# Patient Record
Sex: Male | Born: 1970 | Race: Black or African American | Hispanic: No | Marital: Married | Smoking: Never smoker
Health system: Southern US, Community
[De-identification: ages and names within clinical notes are randomized; demographics above are authoritative.]

## PROBLEM LIST (undated history)

## (undated) ENCOUNTER — Emergency Department (HOSPITAL_COMMUNITY): Admission: EM | Discharge status: Absent without leave | Payer: No Typology Code available for payment source

## (undated) DIAGNOSIS — M2550 Pain in unspecified joint: Secondary | ICD-10-CM

## (undated) DIAGNOSIS — M069 Rheumatoid arthritis, unspecified: Secondary | ICD-10-CM

## (undated) HISTORY — DX: Pain in unspecified joint: M25.50

## (undated) HISTORY — PX: VASECTOMY: SHX75

---

## 2014-09-22 ENCOUNTER — Emergency Department (HOSPITAL_COMMUNITY)
Admission: EM | Admit: 2014-09-22 | Discharge: 2014-09-22 | Disposition: A | Discharge status: Home / Self Care | Payer: Medicaid Other | Source: Home / Self Care | Attending: Emergency Medicine | Admitting: Emergency Medicine

## 2014-09-22 ENCOUNTER — Encounter (HOSPITAL_COMMUNITY): Payer: Self-pay | Admitting: Emergency Medicine

## 2014-09-22 DIAGNOSIS — T148 Other injury of unspecified body region: Secondary | ICD-10-CM

## 2014-09-22 DIAGNOSIS — M62830 Muscle spasm of back: Secondary | ICD-10-CM

## 2014-09-22 DIAGNOSIS — T148XXA Other injury of unspecified body region, initial encounter: Secondary | ICD-10-CM

## 2014-09-22 MED ORDER — CYCLOBENZAPRINE HCL 5 MG PO TABS: 5.0000 mg | ORAL_TABLET | Freq: Three times a day (TID) | ORAL | Status: DC | PRN

## 2014-09-22 MED ORDER — MELOXICAM 7.5 MG PO TABS: 7.5000 mg | ORAL_TABLET | Freq: Every day | ORAL | Status: DC

## 2014-09-22 NOTE — ED Provider Notes (Signed)
CSN: [id number]     Arrival date & time 09/22/14  1615 History   First MD Initiated Contact with Patient 09/22/14 1744     Chief Complaint  Patient presents with  . Back Pain   HPI  44 yo healthy male.   Left sided mid back pain started this am after reaching to pick up child. Has had tenderness in that area previously. No trauma. Denies radiation. Constant since onset. Rated 6/10. Denies sob, fevers, chills, hematuria.   History reviewed. No pertinent past medical history. History reviewed. No pertinent past surgical history. History reviewed. No pertinent family history. History  Substance Use Topics  . Smoking status: Never Smoker   . Smokeless tobacco: Not on file  . Alcohol Use: Yes     Comment: occasional social    Review of Systems See HPI. Allergies  Review of patient's allergies indicates no known allergies.  Home Medications   Prior to Admission medications   Medication Sig Start Date End Date Taking? Authorizing Provider  cyclobenzaprine (FLEXERIL) 5 MG tablet Take 1 tablet (5 mg total) by mouth 3 (three) times daily as needed for muscle spasms. 09/22/14   Raelyn Ensign, PA  meloxicam (MOBIC) 7.5 MG tablet Take 1 tablet (7.5 mg total) by mouth daily. 09/22/14   Kailand Seda, PA   BP 126/76 mmHg  Pulse 50  Temp(Src) 98.3 F (36.8 C) (Oral)  Resp 14  SpO2 100% Physical Exam  Constitutional: He is oriented to person, place, and time. He appears well-developed and well-nourished.  Non-toxic appearance. He does not have a sickly appearance. He does not appear ill. No distress.  BP 126/76 mmHg  Pulse 50  Temp(Src) 98.3 F (36.8 C) (Oral)  Resp 14  SpO2 100%   Musculoskeletal:       Cervical back: Normal.       Thoracic back: He exhibits tenderness and spasm. He exhibits no bony tenderness.       Lumbar back: Normal.       Back:  TTP thoracic left paraspinal. Spasm palpated in area. No overlying skin changes.   Neurological: He is alert and oriented to  person, place, and time.   ED Course  Procedures (including critical care time) Labs Review Labs Reviewed - No data to display  Imaging Review No results found.   MDM   1. Muscle strain   2. Muscle spasm of back    Likely strain with mild spasm. Aggravated previously irritated area this morning. Meloxicam qd. Flexeril q8 prn. Heat, light massage. Light rom as feeling better. RTC 10 days if no relief.     Raelyn Ensign, [place] 09/22/14 1843

## 2014-09-22 NOTE — Discharge Instructions (Signed)
Please take the flexeril every 8 hours to help with the spasm. This may make you drowsy so use caution while working and driving if you take this during the day. Please take the mobic once daily for 2 weeks. Applying heat to the area daily with light massage will help. If you're not improved in 2 weeks please come back to see [place].   Muscle Cramps and Spasms Muscle cramps and spasms occur when a muscle or muscles tighten and you have no control over this tightening (involuntary muscle contraction). They are a common problem and can develop in any muscle. The most common place is in the calf muscles of the leg. Both muscle cramps and muscle spasms are involuntary muscle contractions, but they also have differences:   Muscle cramps are sporadic and painful. They may last a few seconds to a quarter of an hour. Muscle cramps are often more forceful and last longer than muscle spasms.  Muscle spasms may or may not be painful. They may also last just a few seconds or much longer. CAUSES  It is uncommon for cramps or spasms to be due to a serious underlying problem. In many cases, the cause of cramps or spasms is unknown. Some common causes are:   Overexertion.   Overuse from repetitive motions (doing the same thing over and over).   Remaining in a certain position for a long period of time.   Improper preparation, form, or technique while performing a sport or activity.   Dehydration.   Injury.   Side effects of some medicines.   Abnormally low levels of the salts and ions in your blood (electrolytes), especially potassium and calcium. This could happen if you are taking water pills (diuretics) or you are pregnant.  Some underlying medical problems can make it more likely to develop cramps or spasms. These include, but are not limited to:   Diabetes.   Parkinson disease.   Hormone disorders, such as thyroid problems.   Alcohol abuse.   Diseases specific to muscles, joints,  and bones.   Blood vessel disease where not enough blood is getting to the muscles.  HOME CARE INSTRUCTIONS   Stay well hydrated. Drink enough water and fluids to keep your urine clear or pale yellow.  It may be helpful to massage, stretch, and relax the affected muscle.  For tight or tense muscles, use a warm towel, heating pad, or hot shower water directed to the affected area.  If you are sore or have pain after a cramp or spasm, applying ice to the affected area may relieve discomfort.  Put ice in a plastic bag.  Place a towel between your skin and the bag.  Leave the ice on for 15-20 minutes, 03-04 times a day.  Medicines used to treat a known cause of cramps or spasms may help reduce their frequency or severity. Only take over-the-counter or prescription medicines as directed by your caregiver. SEEK MEDICAL CARE IF:  Your cramps or spasms get more severe, more frequent, or do not improve over time.  MAKE SURE YOU:   Understand these instructions.  Will watch your condition.  Will get help right away if you are not doing well or get worse. Document Released: 08/05/2001 Document Revised: 06/10/2012 Document Reviewed: 01/31/2012 [facility] Patient Information 2015 [place], [place]. This information is not intended to replace advice given to you by your health care provider. Make sure you discuss any questions you have with your health care provider.

## 2014-09-22 NOTE — ED Notes (Signed)
Pt states that he has back pain from what he believes is that he pulled something in his back as he was lifting a heavy object

## 2014-11-05 ENCOUNTER — Emergency Department (HOSPITAL_COMMUNITY)
Admission: EM | Admit: 2014-11-05 | Discharge: 2014-11-05 | Disposition: A | Discharge status: Home / Self Care | Payer: Medicaid Other | Attending: Emergency Medicine | Admitting: Emergency Medicine

## 2014-11-05 ENCOUNTER — Emergency Department (HOSPITAL_COMMUNITY)
Admission: EM | Admit: 2014-11-05 | Discharge: 2014-11-05 | Discharge status: Absent without leave | Payer: Medicaid Other | Source: Home / Self Care

## 2014-11-05 ENCOUNTER — Encounter (HOSPITAL_COMMUNITY): Payer: Self-pay | Admitting: Emergency Medicine

## 2014-11-05 DIAGNOSIS — Y280XXA Contact with sharp glass, undetermined intent, initial encounter: Secondary | ICD-10-CM | POA: Insufficient documentation

## 2014-11-05 DIAGNOSIS — S60312A Abrasion of left thumb, initial encounter: Secondary | ICD-10-CM | POA: Insufficient documentation

## 2014-11-05 DIAGNOSIS — S60512A Abrasion of left hand, initial encounter: Secondary | ICD-10-CM | POA: Diagnosis not present

## 2014-11-05 DIAGNOSIS — Y9389 Activity, other specified: Secondary | ICD-10-CM | POA: Insufficient documentation

## 2014-11-05 DIAGNOSIS — Y929 Unspecified place or not applicable: Secondary | ICD-10-CM | POA: Insufficient documentation

## 2014-11-05 DIAGNOSIS — Y999 Unspecified external cause status: Secondary | ICD-10-CM | POA: Diagnosis not present

## 2014-11-05 DIAGNOSIS — S61412A Laceration without foreign body of left hand, initial encounter: Secondary | ICD-10-CM | POA: Insufficient documentation

## 2014-11-05 DIAGNOSIS — Z791 Long term (current) use of non-steroidal anti-inflammatories (NSAID): Secondary | ICD-10-CM | POA: Insufficient documentation

## 2014-11-05 DIAGNOSIS — S6992XA Unspecified injury of left wrist, hand and finger(s), initial encounter: Secondary | ICD-10-CM | POA: Diagnosis present

## 2014-11-05 MED ORDER — BACITRACIN ZINC 500 UNIT/GM EX OINT
TOPICAL_OINTMENT | Freq: Once | CUTANEOUS | Status: AC
  Administered 2014-11-05: 1 via TOPICAL

## 2014-11-05 MED ORDER — TETANUS-DIPHTH-ACELL PERTUSSIS 5-2.5-18.5 LF-MCG/0.5 IM SUSP
0.5000 mL | Freq: Once | INTRAMUSCULAR | Status: AC
  Administered 2014-11-05: 0.5 mL via INTRAMUSCULAR
  Filled 2014-11-05: qty 0.5

## 2014-11-05 MED ORDER — LIDOCAINE HCL (PF) 1 % IJ SOLN
5.0000 mL | Freq: Once | INTRAMUSCULAR | Status: AC
  Administered 2014-11-05: 5 mL
  Filled 2014-11-05: qty 5

## 2014-11-05 NOTE — ED Notes (Signed)
Patient states was moving a sofa and hit his hand on metal trash bin.   Laceration to top of L hand.   Bleeding controlled.

## 2014-11-05 NOTE — ED Provider Notes (Signed)
CSN: [id number]     Arrival date & time 11/05/14  1427 History  This chart was scribed for non-physician practitioner, Kerrie Buffalo, NP working with Arby Barrette, MD, by Jarvis Morgan, ED Scribe. This patient was seen in room TR08C/TR08C and the patient's care was started at 3:01 PM.     Chief Complaint  Patient presents with  . Hand Injury   Patient is a 44 y.o. male presenting with skin laceration. The history is provided by the patient. No language interpreter was used.  Laceration Location:  Hand Hand laceration location:  Dorsum of L hand Length (cm):  1.5 Depth:  Through dermis Bleeding: controlled   Time since incident:  1 hour Laceration mechanism:  Metal edge Pain details:    Severity:  Mild   Timing:  Constant   Progression:  Unchanged Relieved by:  Pressure Tetanus status:  Unknown   HPI Comments: Darryl Reid is a 44 y.o. male who presents to the Emergency Department complaining of a laceration to the dorsum of left hand onset 1 hour ago. Pt states he was moving a sofa and hit his hand on a metal trash can. There is no active bleeding at this time. Pt has a gauze bandage in place over the wound. He is unsure of his tetanus vaccination status. Pt denies any swelling, numbness or weakness.   History reviewed. No pertinent past medical history. History reviewed. No pertinent past surgical history. No family history on file. Social History  Substance Use Topics  . Smoking status: Never Smoker   . Smokeless tobacco: None  . Alcohol Use: Yes     Comment: occasional social    Review of Systems  Musculoskeletal: Negative for joint swelling.  Skin: Positive for wound (dorsum of L hand).  Neurological: Negative for weakness and numbness.  All other systems reviewed and are negative.     Allergies  Review of patient's allergies indicates no known allergies.  Home Medications   Prior to Admission medications   Medication Sig Start Date End Date Taking?  Authorizing Provider  cyclobenzaprine (FLEXERIL) 5 MG tablet Take 1 tablet (5 mg total) by mouth 3 (three) times daily as needed for muscle spasms. 09/22/14   Raelyn Ensign, PA  meloxicam (MOBIC) 7.5 MG tablet Take 1 tablet (7.5 mg total) by mouth daily. 09/22/14   Raelyn Ensign, PA   Triage Vitals: BP 111/89 mmHg  Pulse 70  Temp(Src) 98.6 F (37 C)  Resp 20  SpO2 100%  Physical Exam  Constitutional: He is oriented to person, place, and time. He appears well-developed and well-nourished.  HENT:  Head: Normocephalic and atraumatic.  Eyes: EOM are normal. Pupils are equal, round, and reactive to light.  Neck: Neck supple.  Cardiovascular: Normal rate and regular rhythm.   Pulses:      Radial pulses are 2+ on the right side, and 2+ on the left side.  Pulmonary/Chest: No respiratory distress. He has no wheezes.  Abdominal: Soft. There is no tenderness.  Musculoskeletal: Normal range of motion. He exhibits no edema.       Right hand: He exhibits laceration. He exhibits normal range of motion.  Neurological: He is alert and oriented to person, place, and time. He has normal strength. No cranial nerve deficit.  Skin: Skin is warm and dry. Abrasion and laceration noted.  1.5 cm laceration to the dorsum of left hand below base of index finger 1 cm abrasion at base of thumb  Psychiatric: He has a normal  mood and affect.  Nursing note and vitals reviewed.   ED Course  Procedures (including critical care time)  DIAGNOSTIC STUDIES: Oxygen Saturation is 100% on RA, normal by my interpretation.    COORDINATION OF CARE: 3:12 PM- Pt advised of plan for treatment and pt agrees. Will order t-dap booster injection.  3:19 PM  LACERATION REPAIR Performed by: Kerrie Buffalo, NP Consent: Verbal consent obtained. Risks and benefits: risks, benefits and alternatives were discussed Patient identity confirmed: provided demographic data Prepped and Draped in normal sterile fashion Wound  explored Laceration Location: dorsum of left hand below base of thumb Laceration Length: 1.5cm No Foreign Bodies seen or palpated Cleaned with betadine and irrigated with NSS Anesthesia: local infiltration Local anesthetic: lidocaine 1% w/o epinephrine Anesthetic total: 1 ml Irrigation method: syringe Amount of cleaning: standard Skin closure: 5-0 prolene Number of sutures: 2 Technique: simple interrupted Tetanus ordered Patient tolerance: Patient tolerated the procedure well with no immediate complications.    MDM  44 y.o. male with laceration to the dorsum of the left hand and abrasion to the hand. Stable for d/c without focal neuro deficits. Discussed with the patient plan of care and all questioned fully answered. He will follow up in 7 days for suture removal or sooner if any problems arise.   Final diagnoses:  Laceration of hand, left, initial encounter   I personally performed the services described in this documentation, which was scribed in my presence. The recorded information has been reviewed and is accurate.     [place], [place] 11/05/14 2144  Arby Barrette, MD 11/11/14 [phone]

## 2014-11-05 NOTE — Discharge Instructions (Signed)
Take tylenol or Advil as needed for pain. Follow up in 7 days for suture removal.

## 2014-11-13 ENCOUNTER — Encounter (HOSPITAL_COMMUNITY): Payer: Self-pay | Admitting: Emergency Medicine

## 2014-11-13 ENCOUNTER — Emergency Department (HOSPITAL_COMMUNITY)
Admission: EM | Admit: 2014-11-13 | Discharge: 2014-11-13 | Disposition: A | Discharge status: Home / Self Care | Payer: Medicaid Other | Attending: Emergency Medicine | Admitting: Emergency Medicine

## 2014-11-13 DIAGNOSIS — Z4802 Encounter for removal of sutures: Secondary | ICD-10-CM | POA: Diagnosis present

## 2014-11-13 DIAGNOSIS — Z791 Long term (current) use of non-steroidal anti-inflammatories (NSAID): Secondary | ICD-10-CM | POA: Diagnosis not present

## 2014-11-13 NOTE — Discharge Instructions (Signed)
Read the information below.  You may return to the Emergency Department at any time for worsening condition or any new symptoms that concern you.  Use bacitracin or neosporin and cover the cut with a bandage.  If you develop redness, swelling, pus draining from the wound, or fevers greater than 100.4, return to the ER immediately for a recheck.     Suture Removal, Care After Refer to this sheet in the next few weeks. These instructions provide you with information on caring for yourself after your procedure. Your health care provider may also give you more specific instructions. Your treatment has been planned according to current medical practices, but problems sometimes occur. Call your health care provider if you have any problems or questions after your procedure. WHAT TO EXPECT AFTER THE PROCEDURE After your stitches (sutures) are removed, it is typical to have the following:  Some discomfort and swelling in the wound area.  Slight redness in the area. HOME CARE INSTRUCTIONS   If you have skin adhesive strips over the wound area, do not take the strips off. They will fall off on their own in a few days. If the strips remain in place after 14 days, you may remove them.  Change any bandages (dressings) at least once a day or as directed by your health care provider. If the bandage sticks, soak it off with warm, soapy water.  Apply cream or ointment only as directed by your health care provider. If using cream or ointment, wash the area with soap and water 2 times a day to remove all the cream or ointment. Rinse off the soap and pat the area dry with a clean towel.  Keep the wound area dry and clean. If the bandage becomes wet or dirty, or if it develops a bad smell, change it as soon as possible.  Continue to protect the wound from injury.  Use sunscreen when out in the sun. New scars become sunburned easily. SEEK MEDICAL CARE IF:  You have increasing redness, swelling, or pain in the  wound.  You see pus coming from the wound.  You have a fever.  You notice a bad smell coming from the wound or dressing.  Your wound breaks open (edges not staying together). Document Released: 11/08/2000 Document Revised: 12/04/2012 Document Reviewed: 09/25/2012 [facility] Patient Information 2015 [place], [place]. This information is not intended to replace advice given to you by your health care provider. Make sure you discuss any questions you have with your health care provider.

## 2014-11-13 NOTE — ED Notes (Addendum)
Pt here for suture removal from left hand. No drainage or redness noted.

## 2014-11-13 NOTE — ED Provider Notes (Signed)
CSN: [id number]     Arrival date & time 11/13/14  5188 History   First MD Initiated Contact with Patient 11/13/14 [phone]     Chief Complaint  Patient presents with  . Suture / Staple Removal     (Consider location/radiation/quality/duration/timing/severity/associated sxs/prior Treatment) HPI   Patient presents for suture removal from dorsal left hand.  He had the sutures placed 11/05/14 after accidentally cutting his hand on a trash can.  The wound has been healing well without complications.  Denies fevers, redness, swelling, drainage or bleeding.  Denies weakness or numbness.    History reviewed. No pertinent past medical history. History reviewed. No pertinent past surgical history. No family history on file. Social History  Substance Use Topics  . Smoking status: Never Smoker   . Smokeless tobacco: None  . Alcohol Use: Yes     Comment: occasional social    Review of Systems  Constitutional: Negative for fever and chills.  Musculoskeletal: Negative for myalgias.  Skin: Positive for wound. Negative for color change.  Allergic/Immunologic: Negative for immunocompromised state.  Neurological: Negative for weakness and numbness.  Hematological: Does not bruise/bleed easily.      Allergies  Review of patient's allergies indicates no known allergies.  Home Medications   Prior to Admission medications   Medication Sig Start Date End Date Taking? Authorizing Provider  cyclobenzaprine (FLEXERIL) 5 MG tablet Take 1 tablet (5 mg total) by mouth 3 (three) times daily as needed for muscle spasms. 09/22/14   Raelyn Ensign, PA  meloxicam (MOBIC) 7.5 MG tablet Take 1 tablet (7.5 mg total) by mouth daily. 09/22/14   Todd McVeigh, PA   BP 112/69 mmHg  Pulse 70  Temp(Src) 98.2 F (36.8 C) (Oral)  Resp 18  SpO2 100% Physical Exam  Constitutional: He appears well-developed and well-nourished. No distress.  HENT:  Head: Normocephalic and atraumatic.  Neck: Neck supple.  Cardiovascular:  Normal rate.   Pulmonary/Chest: Effort normal.  Musculoskeletal:  Left dorsal hand with healing laceration.  No erythema, edema, warmth, discharge, or tenderness. No induration or fluctuance. Full active range of motion of all digits, strength 5/5, sensation intact, capillary refill < 2 seconds.    Neurological: He is alert.  Skin: He is not diaphoretic.  Nursing note and vitals reviewed.   ED Course  Procedures (including critical care time) Labs Review Labs Reviewed - No data to display  Imaging Review No results found. I have personally reviewed and evaluated these images and lab results as part of my medical decision-making.   EKG Interpretation None       SUTURE REMOVAL Performed by: Trixie Dredge  Consent: Verbal consent obtained. Patient identity confirmed: provided demographic data Time out: Immediately prior to procedure a "time out" was called to verify the correct patient, procedure, equipment, support staff and site/side marked as required.  Location details: dorsal hand  Wound Appearance: clean  Sutures/Staples Removed: 2  Facility: sutures placed in this facility Patient tolerance: Patient tolerated the procedure well with no immediate complications.     MDM   Final diagnoses:  Visit for suture removal    Afebrile, nontoxic patient with left dorsal hand with 2 sutures in place without infection, healing well.  Sutures removed, wound dressed.   D/C home with wound care instructions.  Discussed result, findings, treatment, and follow up  with patient.  Pt given return precautions.  Pt verbalizes understanding and agrees with plan.         [place], PA-C 11/13/14 [phone]  Margarita Grizzle, MD 11/13/14 [phone]

## 2014-12-08 ENCOUNTER — Emergency Department (HOSPITAL_COMMUNITY): Payer: No Typology Code available for payment source

## 2014-12-08 ENCOUNTER — Encounter (HOSPITAL_COMMUNITY): Payer: Self-pay | Admitting: Emergency Medicine

## 2014-12-08 ENCOUNTER — Emergency Department (HOSPITAL_COMMUNITY)
Admission: EM | Admit: 2014-12-08 | Discharge: 2014-12-08 | Disposition: A | Discharge status: Home / Self Care | Payer: No Typology Code available for payment source | Attending: Emergency Medicine | Admitting: Emergency Medicine

## 2014-12-08 DIAGNOSIS — S4992XA Unspecified injury of left shoulder and upper arm, initial encounter: Secondary | ICD-10-CM | POA: Diagnosis present

## 2014-12-08 DIAGNOSIS — Y92410 Unspecified street and highway as the place of occurrence of the external cause: Secondary | ICD-10-CM | POA: Insufficient documentation

## 2014-12-08 DIAGNOSIS — Y998 Other external cause status: Secondary | ICD-10-CM | POA: Diagnosis not present

## 2014-12-08 DIAGNOSIS — R911 Solitary pulmonary nodule: Secondary | ICD-10-CM | POA: Diagnosis not present

## 2014-12-08 DIAGNOSIS — S0990XA Unspecified injury of head, initial encounter: Secondary | ICD-10-CM | POA: Insufficient documentation

## 2014-12-08 DIAGNOSIS — Y9389 Activity, other specified: Secondary | ICD-10-CM | POA: Insufficient documentation

## 2014-12-08 MED ORDER — METHOCARBAMOL 500 MG PO TABS: 500.0000 mg | ORAL_TABLET | Freq: Two times a day (BID) | ORAL | Status: DC

## 2014-12-08 MED ORDER — MELOXICAM 7.5 MG PO TABS: 7.5000 mg | ORAL_TABLET | Freq: Every day | ORAL | Status: DC

## 2014-12-08 MED ORDER — KETOROLAC TROMETHAMINE 60 MG/2ML IM SOLN
60.0000 mg | Freq: Once | INTRAMUSCULAR | Status: AC
  Administered 2014-12-08: 60 mg via INTRAMUSCULAR
  Filled 2014-12-08: qty 2
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
## 2014-12-08 NOTE — Discharge Instructions (Signed)
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
## 2015-01-06 ENCOUNTER — Ambulatory Visit (INDEPENDENT_AMBULATORY_CARE_PROVIDER_SITE_OTHER): Payer: Medicaid Other | Admitting: Family Medicine

## 2015-01-06 VITALS — BP 123/78 | HR 86 | Temp 98.2°F | Ht 65.5 in | Wt 133.6 lb

## 2015-01-06 DIAGNOSIS — Z008 Encounter for other general examination: Secondary | ICD-10-CM

## 2015-01-06 DIAGNOSIS — Z0289 Encounter for other administrative examinations: Secondary | ICD-10-CM

## 2015-01-06 DIAGNOSIS — M545 Low back pain, unspecified: Secondary | ICD-10-CM | POA: Insufficient documentation

## 2015-01-06 DIAGNOSIS — G8929 Other chronic pain: Secondary | ICD-10-CM | POA: Diagnosis not present

## 2015-01-06 NOTE — Patient Instructions (Addendum)
Please follow up on 12/8 If you have questions or concerns, call the office at [phone]  Back Pain, Adult Back pain is very common. The pain often gets better over time. The cause of back pain is usually not dangerous. Most people can learn to manage their back pain on their own.  HOME CARE  Watch your back pain for any changes. The following actions may help to lessen any pain you are feeling:  Stay active. Start with short walks on flat ground if you can. Try to walk farther each day.  Exercise regularly as told by your doctor. Exercise helps your back heal faster. It also helps avoid future injury by keeping your muscles strong and flexible.  Do not sit, drive, or stand in one place for more than 30 minutes.  Do not stay in bed. Resting more than 1-2 days can slow down your recovery.  Be careful when you bend or lift an object. Use good form when lifting:  Bend at your knees.  Keep the object close to your body.  Do not twist.  Sleep on a firm mattress. Lie on your side, and bend your knees. If you lie on your back, put a pillow under your knees.  Take medicines only as told by your doctor.  Put ice on the injured area.  Put ice in a plastic bag.  Place a towel between your skin and the bag.  Leave the ice on for 20 minutes, 2-3 times a day for the first 2-3 days. After that, you can switch between ice and heat packs.  Avoid feeling anxious or stressed. Find good ways to deal with stress, such as exercise.  Maintain a healthy weight. Extra weight puts stress on your back. GET HELP IF:   You have pain that does not go away with rest or medicine.  You have worsening pain that goes down into your legs or buttocks.  You have pain that does not get better in one week.  You have pain at night.  You lose weight.  You have a fever or chills. GET HELP RIGHT AWAY IF:   You cannot control when you poop (bowel movement) or pee (urinate).  Your arms or legs feel  weak.  Your arms or legs lose feeling (numbness).  You feel sick to your stomach (nauseous) or throw up (vomit).  You have belly (abdominal) pain.  You feel like you may pass out (faint).   This information is not intended to replace advice given to you by your health care provider. Make sure you discuss any questions you have with your health care provider.   Document Released: 08/02/2007 Document Revised: 03/06/2014 Document Reviewed: 06/17/2013 Elsevier Interactive Patient Education [employer]

## 2015-01-06 NOTE — Assessment & Plan Note (Signed)
Likely chronic musculoskeletal pain contributed to by injury histroy -Pt to continue with NSAIDs, topical pain relief. Exercises also discussed and print outs given

## 2015-01-06 NOTE — Assessment & Plan Note (Signed)
Follow up labs

## 2015-01-06 NOTE — Progress Notes (Signed)
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 It " just happened" and was not associated with any trauma, he had no radiation at that time, numbness, tingling, loss or bowel or bladder function The pain worsens with activity or prolonged sitting, standing makes it better,   He also reports fainted on 10/11 after working all night. He suddenly felt hot and dizzy then fell to the floor and lost consciousness. He was in the shower initially but when he felt dizzy he left the shower. His wife found him on the floor. He thinks he lost consciousness for 2-3 minutes. He denies shaking or jerking movements and was normal after woke up  Denies recent illness, fever, cough, N/VD  ROS:  See HPI  Past Medical Hx:  - denies  Past Surgical Hx:  -denies  Family Hx: updated in Epic - Number of family members:  4 - Number of family members in [place]:  4  Immigrant Social History: - Name spelling correct?: Darryl Reid - Date arrived in [place]: 09/08/2014 - Country of origin: [place] - traveled to [place] in 2012 after leaving the [place] due to the war - Primary language: Swahili  -Requires intepreter (essentially speaks no [place]) - Education: Highest level of education: 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  No   Preventative Care Histor -Seen at health department?: Yes  PHYSICAL EXAM: BP 123/78 mmHg  Pulse 86  Temp(Src) 98.2 F (36.8 C) (Oral)  Ht 5' 5.5" (1.664 m)  Wt 133 lb 9.6 oz (60.601 kg)  BMI 21.89 kg/m2 Gen: NAD HEENT: PERRL, no conjunctival pallor, NCAT Neck:  No  lymphadenopathy, no masses palpated Heart: RRR Lungs: CTAB, normal WOB Abdomen: soft, non tender, no organomegally Skin:  No rashes or lesions palpated MSK: Normal leg raise, no tack tenderness, some concern for back muscle spasm Neuro: no focal deficits Psych: normal mood and affect  Examined and interviewed with Dr. Gwendolyn Grant  Refugee health examination Follow up labs  Chronic low back pain Likely chronic musculoskeletal pain contributed to by injury histroy -Pt to continue with NSAIDs, topical pain relief. Exercises also discussed and print outs given

## 2015-01-27 ENCOUNTER — Ambulatory Visit: Payer: Medicaid Other | Admitting: Student

## 2015-02-04 ENCOUNTER — Ambulatory Visit (INDEPENDENT_AMBULATORY_CARE_PROVIDER_SITE_OTHER): Payer: Medicaid Other | Admitting: Student

## 2015-02-04 ENCOUNTER — Encounter: Payer: Self-pay | Admitting: Student

## 2015-02-04 VITALS — BP 109/74 | HR 57 | Temp 98.4°F | Wt 131.7 lb

## 2015-02-04 DIAGNOSIS — Z008 Encounter for other general examination: Secondary | ICD-10-CM | POA: Diagnosis not present

## 2015-02-04 DIAGNOSIS — Z0289 Encounter for other administrative examinations: Secondary | ICD-10-CM

## 2015-02-04 DIAGNOSIS — R55 Syncope and collapse: Secondary | ICD-10-CM

## 2015-02-04 NOTE — Patient Instructions (Signed)
Follow up in 3 months Please bring your family's health department information If you have questions or concerns, please call the office at [phone]

## 2015-02-05 NOTE — Progress Notes (Signed)
   Subjective:    Patient ID: Roney Jaffe, male    DOB: 07/28/1970, 44 y.o.   MRN: [id number]  No interpreter needed as he speaks fluent english  CC: follow up  HPI 44 y/o refugee presents for follow up. He has no complaints today  Refugee status - He states he has been to the health department and has had his vaccines as well as labs drawn.  - He has this documentation at home  Otherwise, denies recent fevers/chills, illnesses, chest pain, SOB  Review of Systems   See HPI for ROS.     Objective:  BP 109/74 mmHg  Pulse 57  Temp(Src) 98.4 F (36.9 C) (Oral)  Wt 131 lb 11.2 oz (59.739 kg) Vitals and nursing note reviewed  General: NAD Cardiac: RRR, Respiratory: CTAB, normal effort Abdomen: soft, nontender,  Skin: warm and dry, no rashes noted Neuro: alert and oriented, no focal deficits   Assessment & Plan:    Refugee health examination Has had vaccines as well as refugee labs drawn at the health department - He will bring this documentation to the clinic and we will supplement testing as needed - Will not draw all labs now to reduce unnecessary bills     Agnieszka Newhouse A. Kennon Rounds MD, MS Family Medicine Resident PGY-2 Pager [phone]

## 2015-02-05 NOTE — Assessment & Plan Note (Signed)
Has had vaccines as well as refugee labs drawn at the health department - He will bring this documentation to the clinic and we will supplement testing as needed - Will not draw all labs now to reduce unnecessary bills

## 2015-05-04 ENCOUNTER — Ambulatory Visit: Payer: Medicaid Other | Admitting: Student
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
## 2018-04-29 ENCOUNTER — Ambulatory Visit (HOSPITAL_COMMUNITY)
Admission: EM | Admit: 2018-04-29 | Discharge: 2018-04-29 | Disposition: A | Discharge status: Home / Self Care | Payer: Self-pay | Attending: Internal Medicine | Admitting: Internal Medicine

## 2018-04-29 ENCOUNTER — Other Ambulatory Visit: Payer: Self-pay

## 2018-04-29 ENCOUNTER — Encounter (HOSPITAL_COMMUNITY): Payer: Self-pay | Admitting: Emergency Medicine

## 2018-04-29 DIAGNOSIS — R69 Illness, unspecified: Secondary | ICD-10-CM

## 2018-04-29 DIAGNOSIS — J111 Influenza due to unidentified influenza virus with other respiratory manifestations: Secondary | ICD-10-CM

## 2018-04-29 MED ORDER — GUAIFENESIN ER 600 MG PO TB12: 1200.0000 mg | ORAL_TABLET | Freq: Two times a day (BID) | ORAL | 0 refills | Status: AC | PRN

## 2018-04-29 MED ORDER — IBUPROFEN 800 MG PO TABS: 800.0000 mg | ORAL_TABLET | Freq: Three times a day (TID) | ORAL | 0 refills | Status: DC

## 2018-04-29 MED ORDER — KETOROLAC TROMETHAMINE 60 MG/2ML IM SOLN
60.0000 mg | Freq: Once | INTRAMUSCULAR | Status: AC
  Administered 2018-04-29: 60 mg via INTRAMUSCULAR

## 2018-04-29 MED ORDER — KETOROLAC TROMETHAMINE 60 MG/2ML IM SOLN
INTRAMUSCULAR | Status: AC
  Filled 2018-04-29: qty 2

## 2018-04-29 NOTE — ED Triage Notes (Signed)
Coughing started on Thursday ( 2/27 ).  Generalized weakness, cold chills, and headaches

## 2018-04-29 NOTE — Discharge Instructions (Signed)
Push fluids to ensure adequate hydration and keep secretions thin.  Tylenol and/or ibuprofen as needed for pain or fevers.  Rest.  Mucinex to loosen secretion.  If symptoms worsen or do not improve in the next week to return to be seen or to follow up with your PCP.

## 2018-04-30 NOTE — ED Provider Notes (Signed)
[facility]    CSN: [id number] Arrival date & time: 04/29/18  1546     History   Chief Complaint Chief Complaint  Patient presents with  . Cough    HPI Darryl Reid is a 48 y.o. male.   Darryl Reid presents with complaints of weakness, productive cough, headache, body aches which started three days ago. Unknown if fevers. States he has a "bad taste" in his mouth but no other gi/gu complaints. No ear pain. No rash. Sometimes feels shortness of breath but not consistently. No known ill contacts. No rash. Hasn't worsened. Hasn't taken any medications today for symptoms, took ibuprofen and nyquil yesterday. No chest pain . No dizziness. Generalized pain 4/10. Without contributing medical history.      ROS per HPI.      History reviewed. No pertinent past medical history.  Patient Active Problem List   Diagnosis Date Noted  . Vasovagal syncope 02/04/2015  . Refugee health examination 01/06/2015  . Chronic low back pain 01/06/2015    History reviewed. No pertinent surgical history.     Home Medications    Prior to Admission medications   Medication Sig Start Date End Date Taking? Authorizing Provider  acetaminophen (TYLENOL) 325 MG tablet Take 650 mg by mouth every 6 (six) hours as needed.   Yes [provider]  guaiFENesin (MUCINEX) 600 MG 12 hr tablet Take 2 tablets (1,200 mg total) by mouth 2 (two) times daily as needed for up to 5 days. 04/29/18 05/04/18  Georgetta Haber, NP  ibuprofen (ADVIL,MOTRIN) 800 MG tablet Take 1 tablet (800 mg total) by mouth 3 (three) times daily. 04/29/18   Georgetta Haber, NP    Family History No family history on file.  Social History Social History   Tobacco Use  . Smoking status: Never Smoker  Substance Use Topics  . Alcohol use: Yes    Comment: occasional social  . Drug use: No     Allergies   Patient has no known allergies.   Review of Systems Review of Systems   Physical Exam Triage  Vital Signs ED Triage Vitals  Enc Vitals Group     BP 04/29/18 1711 109/63     Pulse Rate 04/29/18 1711 70     Resp 04/29/18 1711 16     Temp 04/29/18 1711 98.9 F (37.2 C)     Temp Source 04/29/18 1711 Temporal     SpO2 04/29/18 1711 100 %     Weight --      Height --      Head Circumference --      Peak Flow --      Pain Score 04/29/18 1709 4     Pain Loc --      Pain Edu? --      Excl. in GC? --    No data found.  Updated Vital Signs BP 109/63 (BP Location: Right Arm)   Pulse 70   Temp 98.9 F (37.2 C) (Temporal)   Resp 16   SpO2 100%   Visual Acuity Right Eye Distance:   Left Eye Distance:   Bilateral Distance:    Right Eye Near:   Left Eye Near:    Bilateral Near:     Physical Exam Vitals signs reviewed.  Constitutional:      Appearance: He is well-developed.  HENT:     Head: Normocephalic and atraumatic.     Right Ear: Tympanic membrane, ear canal and external ear normal.  Left Ear: Tympanic membrane, ear canal and external ear normal.     Nose: Nose normal.     Right Sinus: No maxillary sinus tenderness or frontal sinus tenderness.     Left Sinus: No maxillary sinus tenderness or frontal sinus tenderness.     Mouth/Throat:     Pharynx: Uvula midline.  Eyes:     Conjunctiva/sclera: Conjunctivae normal.     Pupils: Pupils are equal, round, and reactive to light.  Neck:     Musculoskeletal: Normal range of motion.  Cardiovascular:     Rate and Rhythm: Normal rate and regular rhythm.  Pulmonary:     Effort: Pulmonary effort is normal.     Breath sounds: Normal breath sounds.  Lymphadenopathy:     Cervical: No cervical adenopathy.  Skin:    General: Skin is warm and dry.  Neurological:     Mental Status: He is alert and oriented to person, place, and time.      UC Treatments / Results  Labs (all labs ordered are listed, but only abnormal results are displayed) Labs Reviewed - No data to display  EKG None  Radiology No results  found.  Procedures Procedures (including critical care time)  Medications Ordered in UC Medications  ketorolac (TORADOL) injection 60 mg (60 mg Intramuscular Given 04/29/18 1805)    Initial Impression / Assessment and Plan / UC Course  I have reviewed the triage vital signs and the nursing notes.  Pertinent labs & imaging results that were available during my care of the patient were reviewed by me and considered in my medical decision making (see chart for details).     Non toxic. Afebrile. No tachycardia or tachypnea. No hypoxia. Benign physical exam. History and physical consistent with viral illness.  Supportive cares recommended. Return precautions provided. If symptoms worsen or do not improve in the next week to return to be seen or to follow up with PCP.  Patient verbalized understanding and agreeable to plan.   Final Clinical Impressions(s) / UC Diagnoses   Final diagnoses:  Influenza-like illness     Discharge Instructions     Push fluids to ensure adequate hydration and keep secretions thin.  Tylenol and/or ibuprofen as needed for pain or fevers.  Rest.  Mucinex to loosen secretion.  If symptoms worsen or do not improve in the next week to return to be seen or to follow up with your PCP.     ED Prescriptions    Medication Sig Dispense Auth. Provider   ibuprofen (ADVIL,MOTRIN) 800 MG tablet Take 1 tablet (800 mg total) by mouth 3 (three) times daily. 21 tablet Linus Mako B, NP   guaiFENesin (MUCINEX) 600 MG 12 hr tablet Take 2 tablets (1,200 mg total) by mouth 2 (two) times daily as needed for up to 5 days. 20 tablet Georgetta Haber, NP     Controlled Substance Prescriptions [place] Controlled Substance Registry consulted? Not Applicable   Georgetta Haber, NP 04/30/18 1146

## 2022-03-09 ENCOUNTER — Ambulatory Visit (HOSPITAL_COMMUNITY)
Admission: EM | Admit: 2022-03-09 | Discharge: 2022-03-09 | Disposition: A | Discharge status: Home / Self Care | Payer: Medicaid Other | Attending: Urgent Care | Admitting: Urgent Care

## 2022-03-09 ENCOUNTER — Encounter (HOSPITAL_COMMUNITY): Payer: Self-pay | Admitting: Urgent Care

## 2022-03-09 DIAGNOSIS — M654 Radial styloid tenosynovitis [de Quervain]: Secondary | ICD-10-CM

## 2022-03-09 DIAGNOSIS — M25571 Pain in right ankle and joints of right foot: Secondary | ICD-10-CM | POA: Diagnosis not present

## 2022-03-09 DIAGNOSIS — M25572 Pain in left ankle and joints of left foot: Secondary | ICD-10-CM | POA: Diagnosis not present

## 2022-03-09 MED ORDER — PREDNISONE 10 MG (21) PO TBPK: ORAL_TABLET | Freq: Every day | ORAL | 0 refills | Status: DC

## 2022-03-09 NOTE — ED Triage Notes (Addendum)
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 Reports since Monday having pain and swelling to left hand at thumb and index finger.  Today c/o bilat ankle pains but worse on left ankle.  Took ibuprofen today to help with pain

## 2022-03-09 NOTE — Discharge Instructions (Signed)
You likely are experiencing a radial styloid tenosynovitis of your left hand. You might also be experiencing an overuse/trauma induced gout. Please start taking the prednisone as prescribed per package instructions for the next 6 days.  I would recommend starting tomorrow morning to prevent insomnia this evening. Please wear the wrist brace with thumb spica for 2 weeks. If your symptoms persist

## 2022-03-09 NOTE — ED Provider Notes (Signed)
[place]    CSN: [id number] Arrival date & time: 03/09/22  1805      History   Chief Complaint Chief Complaint  Patient presents with   Hand Pain   Ankle Pain    HPI Darryl Reid is a 52 y.o. male.   Pleasant 52 year old male with no contributory past medical history presents today due to concerns primarily of left wrist pain.  He states he has a new job as of 2-1/2 weeks ago, and since Monday has been having severe pain in his left wrist.  His job is to manipulate thousands of tiny parts.  With his left hand, he has been forcefully pinching small objects together while manipulating stands.  He states this repetition has caused significant discomfort primarily to the radial aspect of his left wrist.  He states it is also swollen and warm.  He has tried Tylenol and ibuprofen which have minimally helped.  He states just today he started having slight discomfort to his left lateral ankle as well, but this is only minimal.  He states he sits for his job and does not stand.  Denies a known history of gout.   Hand Pain  Ankle Pain   History reviewed. No pertinent past medical history.  Patient Active Problem List   Diagnosis Date Noted   Vasovagal syncope 02/04/2015   Refugee health examination 01/06/2015   Chronic low back pain 01/06/2015    History reviewed. No pertinent surgical history.     Home Medications    Prior to Admission medications   Medication Sig Start Date End Date Taking? Authorizing Provider  predniSONE (STERAPRED UNI-PAK 21 TAB) 10 MG (21) TBPK tablet Take by mouth daily. Take 6 tabs by mouth daily  for 1 days, then 5 tabs for 1 days, then 4 tabs for 1 days, then 3 tabs for 1 days, 2 tabs for 1 days, then 1 tab by mouth daily for 1 days 03/09/22  Yes Wyn Nettle L, PA  acetaminophen (TYLENOL) 325 MG tablet Take 650 mg by mouth every 6 (six) hours as needed.    [provider]  ibuprofen (ADVIL,MOTRIN) 800 MG tablet Take 1  tablet (800 mg total) by mouth 3 (three) times daily. 04/29/18   Zigmund Gottron, NP    Family History History reviewed. No pertinent family history.  Social History Social History   Tobacco Use   Smoking status: Never  Substance Use Topics   Alcohol use: Yes    Comment: occasional social   Drug use: No     Allergies   Patient has no known allergies.   Review of Systems Review of Systems As per HPI  Physical Exam Triage Vital Signs ED Triage Vitals  Enc Vitals Group     BP 03/09/22 1934 139/85     Pulse Rate 03/09/22 1934 98     Resp 03/09/22 1934 17     Temp 03/09/22 1934 99.3 F (37.4 C)     Temp Source 03/09/22 1934 Oral     SpO2 03/09/22 1934 97 %     Weight --      Height --      Head Circumference --      Peak Flow --      Pain Score 03/09/22 1933 7     Pain Loc --      Pain Edu? --      Excl. in [place]? --    No data found.  Updated Vital  Signs BP 139/85 (BP Location: Right Arm)   Pulse 98   Temp 99.3 F (37.4 C) (Oral)   Resp 17   SpO2 97%   Visual Acuity Right Eye Distance:   Left Eye Distance:   Bilateral Distance:    Right Eye Near:   Left Eye Near:    Bilateral Near:     Physical Exam Vitals and nursing note reviewed.  Constitutional:      General: He is not in acute distress.    Appearance: Normal appearance. He is normal weight. He is not ill-appearing, toxic-appearing or diaphoretic.  HENT:     Head: Normocephalic and atraumatic.  Musculoskeletal:        General: Swelling and tenderness present.     Right elbow: Normal.     Left elbow: Normal.     Right forearm: Normal.     Left forearm: Normal.     Right wrist: Normal. No swelling, deformity, effusion, lacerations, tenderness, bony tenderness, snuff box tenderness or crepitus. Normal range of motion. Normal pulse.     Left wrist: Swelling (and erythema to radial aspect only) and tenderness present. No snuff box tenderness or crepitus. Decreased range of motion.     Left hand:  Swelling (to dorsal, lateral carpal bones) present.     Right lower leg: Normal. No lacerations, tenderness or bony tenderness.     Left lower leg: Normal. No lacerations, tenderness or bony tenderness.     Right ankle: No swelling, deformity, ecchymosis or lacerations. Tenderness present over the ATF ligament. No lateral malleolus, medial malleolus, AITF ligament, CF ligament, posterior TF ligament, base of 5th metatarsal or proximal fibula tenderness. Normal range of motion.     Right Achilles Tendon: Normal. No tenderness. Thompson's test negative.     Left ankle: No swelling, deformity, ecchymosis or lacerations. Tenderness present over the ATF ligament. No lateral malleolus, medial malleolus, AITF ligament, CF ligament, posterior TF ligament, base of 5th metatarsal or proximal fibula tenderness. Normal range of motion.     Left Achilles Tendon: Normal. No tenderness. Thompson's test negative.       Legs:     Comments: Positive Finkelstein's test  Neurological:     Mental Status: He is alert.      UC Treatments / Results  Labs (all labs ordered are listed, but only abnormal results are displayed) Labs Reviewed - No data to display  EKG   Radiology No results found.  Procedures Procedures (including critical care time)  Medications Ordered in UC Medications - No data to display  Initial Impression / Assessment and Plan / UC Course  I have reviewed the triage vital signs and the nursing notes.  Pertinent labs & imaging results that were available during my care of the patient were reviewed by me and considered in my medical decision making (see chart for details).     Radial styloid tenosynovitis - symptoms most consistent with radial styloid tenosynovitis on the left wrist, however I cannot exclude a trauma induced gout.  Therefore, to cover both possibilities, I will treat with prednisone.  Will also recommend wrist brace with thumb spica to prevent excessive range of  motion.  Patient to wear this x 2 weeks.  Should symptoms persist past 2 weeks he is to follow-up with sports medicine or Ortho. Ankle pain -this just started this morning.  Overall benign exam.  He had very mild reproducible discomfort to his left anterior talofibular ligament, not much on the right.  Again, will  use the above treatment for his wrist to also help with his ankles.  Avoid excessive pressure on the area or overuse.   Final Clinical Impressions(s) / UC Diagnoses   Final diagnoses:  Radial styloid tenosynovitis of left hand  Acute bilateral ankle pain     Discharge Instructions      You likely are experiencing a radial styloid tenosynovitis of your left hand. You might also be experiencing an overuse/trauma induced gout. Please start taking the prednisone as prescribed per package instructions for the next 6 days.  I would recommend starting tomorrow morning to prevent insomnia this evening. Please wear the wrist brace with thumb spica for 2 weeks. If your symptoms persist     ED Prescriptions     Medication Sig Dispense Auth. Provider   predniSONE (STERAPRED UNI-PAK 21 TAB) 10 MG (21) TBPK tablet Take by mouth daily. Take 6 tabs by mouth daily  for 1 days, then 5 tabs for 1 days, then 4 tabs for 1 days, then 3 tabs for 1 days, 2 tabs for 1 days, then 1 tab by mouth daily for 1 days 21 tablet Analyah Mcconnon L, PA      PDMP not reviewed this encounter.   Chaney Malling, [place] 03/09/22 2030

## 2022-03-11 ENCOUNTER — Encounter (HOSPITAL_COMMUNITY): Payer: Self-pay

## 2022-03-11 ENCOUNTER — Observation Stay (HOSPITAL_BASED_OUTPATIENT_CLINIC_OR_DEPARTMENT_OTHER)
Admission: EM | Admit: 2022-03-11 | Discharge: 2022-03-13 | Disposition: A | Discharge status: Home / Self Care | Payer: Medicaid Other | Attending: Internal Medicine | Admitting: Internal Medicine

## 2022-03-11 ENCOUNTER — Encounter (HOSPITAL_BASED_OUTPATIENT_CLINIC_OR_DEPARTMENT_OTHER): Payer: Self-pay | Admitting: Emergency Medicine

## 2022-03-11 ENCOUNTER — Other Ambulatory Visit: Payer: Self-pay

## 2022-03-11 ENCOUNTER — Emergency Department (HOSPITAL_BASED_OUTPATIENT_CLINIC_OR_DEPARTMENT_OTHER): Payer: Medicaid Other

## 2022-03-11 DIAGNOSIS — R945 Abnormal results of liver function studies: Secondary | ICD-10-CM | POA: Diagnosis not present

## 2022-03-11 DIAGNOSIS — M7989 Other specified soft tissue disorders: Secondary | ICD-10-CM | POA: Diagnosis not present

## 2022-03-11 DIAGNOSIS — M25571 Pain in right ankle and joints of right foot: Secondary | ICD-10-CM | POA: Diagnosis not present

## 2022-03-11 DIAGNOSIS — D72829 Elevated white blood cell count, unspecified: Secondary | ICD-10-CM | POA: Diagnosis not present

## 2022-03-11 DIAGNOSIS — R7989 Other specified abnormal findings of blood chemistry: Secondary | ICD-10-CM | POA: Diagnosis present

## 2022-03-11 DIAGNOSIS — M25572 Pain in left ankle and joints of left foot: Secondary | ICD-10-CM | POA: Diagnosis not present

## 2022-03-11 DIAGNOSIS — M2550 Pain in unspecified joint: Secondary | ICD-10-CM | POA: Diagnosis present

## 2022-03-11 LAB — CBC WITH DIFFERENTIAL/PLATELET
Abs Immature Granulocytes: 0.11 10*3/uL — ABNORMAL HIGH (ref 0.00–0.07)
Basophils Absolute: 0 10*3/uL (ref 0.0–0.1)
Basophils Relative: 0 %
Eosinophils Absolute: 0 10*3/uL (ref 0.0–0.5)
Eosinophils Relative: 0 %
HCT: 39.1 % (ref 39.0–52.0)
Hemoglobin: 14.1 g/dL (ref 13.0–17.0)
Immature Granulocytes: 1 %
Lymphocytes Relative: 7 %
Lymphs Abs: 1.1 10*3/uL (ref 0.7–4.0)
MCH: 32.6 pg (ref 26.0–34.0)
MCHC: 36.1 g/dL — ABNORMAL HIGH (ref 30.0–36.0)
MCV: 90.3 fL (ref 80.0–100.0)
Monocytes Absolute: 1.4 10*3/uL — ABNORMAL HIGH (ref 0.1–1.0)
Monocytes Relative: 8 %
Neutro Abs: 14 10*3/uL — ABNORMAL HIGH (ref 1.7–7.7)
Neutrophils Relative %: 84 %
Platelets: 290 10*3/uL (ref 150–400)
RBC: 4.33 MIL/uL (ref 4.22–5.81)
RDW: 12.3 % (ref 11.5–15.5)
WBC: 16.6 10*3/uL — ABNORMAL HIGH (ref 4.0–10.5)
nRBC: 0 % (ref 0.0–0.2)

## 2022-03-11 LAB — COMPREHENSIVE METABOLIC PANEL
ALT: 68 U/L — ABNORMAL HIGH (ref 0–44)
AST: 81 U/L — ABNORMAL HIGH (ref 15–41)
Albumin: 3.5 g/dL (ref 3.5–5.0)
Alkaline Phosphatase: 75 U/L (ref 38–126)
Anion gap: 11 (ref 5–15)
BUN: 14 mg/dL (ref 6–20)
CO2: 22 mmol/L (ref 22–32)
Calcium: 8.7 mg/dL — ABNORMAL LOW (ref 8.9–10.3)
Chloride: 100 mmol/L (ref 98–111)
Creatinine, Ser: 1.01 mg/dL (ref 0.61–1.24)
GFR, Estimated: >60 mL/min (ref >60)
Glucose, Bld: 135 mg/dL — ABNORMAL HIGH (ref 70–99)
Potassium: 3.7 mmol/L (ref 3.5–5.1)
Sodium: 133 mmol/L — ABNORMAL LOW (ref 135–145)
Total Bilirubin: 1 mg/dL (ref 0.3–1.2)
Total Protein: 8.1 g/dL (ref 6.5–8.1)

## 2022-03-11 LAB — SEDIMENTATION RATE: Sed Rate: 94 mm/hr — ABNORMAL HIGH (ref 0–16)

## 2022-03-11 LAB — C-REACTIVE PROTEIN: CRP: 20.8 mg/dL — ABNORMAL HIGH (ref <1.0)

## 2022-03-11 MED ORDER — HYDROCODONE-ACETAMINOPHEN 5-325 MG PO TABS
1.0000 | ORAL_TABLET | Freq: Once | ORAL | Status: AC
  Administered 2022-03-11: 1 via ORAL
  Filled 2022-03-11: qty 1

## 2022-03-11 NOTE — ED Notes (Signed)
Pt stood with assistance and some degree of difficulty; took a step with each foot (while being assisted on both sides) with more difficulty; pt sts it feels difficult to lift leg in order to take the step; he does say it is easier now than earlier today when he was having to crawl

## 2022-03-11 NOTE — ED Provider Notes (Signed)
[facility] EMERGENCY DEPARTMENT Provider Note   CSN: [id number] Arrival date & time: 03/11/22  1811     History  Chief Complaint  Patient presents with   Ankle Pain    Darryl Reid is a 52 y.o. male.  52 year old male with a past medical history of gout presents to the ED with a chief complaint of bilateral ankle pain for the past 3 days.  Diagnosed with tenosynovitis of the left wrist a couple of days ago after seen by orthopedics.  Reports his bilateral ankle pain has now worsened.  Pain is worse on the left over the right.  He has not had any trauma.  He does work for a living with repetitive movements.  He has not been running any fevers.  He then recalls no trauma.  Patient has been taking steroids for symptomatic treatment without any improvement. He denies any other complaints.   The history is provided by the patient and medical records.  Ankle Pain Associated symptoms: no fever        Home Medications Prior to Admission medications   Medication Sig Start Date End Date Taking? Authorizing Provider  acetaminophen (TYLENOL) 325 MG tablet Take 650 mg by mouth every 6 (six) hours as needed.    [provider]  ibuprofen (ADVIL,MOTRIN) 800 MG tablet Take 1 tablet (800 mg total) by mouth 3 (three) times daily. 04/29/18   Zigmund Gottron, NP  predniSONE (STERAPRED UNI-PAK 21 TAB) 10 MG (21) TBPK tablet Take by mouth daily. Take 6 tabs by mouth daily  for 1 days, then 5 tabs for 1 days, then 4 tabs for 1 days, then 3 tabs for 1 days, 2 tabs for 1 days, then 1 tab by mouth daily for 1 days 03/09/22   Geryl Councilman L, PA      Allergies    Patient has no known allergies.    Review of Systems   Review of Systems  Constitutional:  Negative for fever.  Respiratory:  Negative for shortness of breath.   Cardiovascular:  Negative for chest pain.  Musculoskeletal:  Positive for arthralgias.  All other systems reviewed and are negative.   Physical  Exam Updated Vital Signs BP 125/87 (BP Location: Left Arm)   Pulse 80   Temp 98.9 F (37.2 C) (Oral)   Resp 16   Ht 5\' 7"  (1.702 m)   Wt 55 kg   SpO2 100%   BMI 18.99 kg/m  Physical Exam Vitals and nursing note reviewed.  Constitutional:      Appearance: Normal appearance.  HENT:     Head: Normocephalic and atraumatic.     Mouth/Throat:     Mouth: Mucous membranes are moist.  Cardiovascular:     Rate and Rhythm: Normal rate.  Pulmonary:     Effort: Pulmonary effort is normal.  Abdominal:     General: Abdomen is flat.  Musculoskeletal:     Cervical back: Normal range of motion and neck supple.     Right ankle: Tenderness present over the lateral malleolus. Decreased range of motion.     Right Achilles Tendon: Tenderness present.     Left ankle: Tenderness present over the lateral malleolus. Decreased range of motion.     Left Achilles Tendon: Tenderness present.     Comments: Pulses present, difficult exam with severe pain with dorsiflexion and plantarflexion.  Sensation is intact throughout, mild erythema also noted.  Skin:    General: Skin is warm and dry.  Neurological:     Mental Status: He is alert and oriented to person, place, and time.     ED Results / Procedures / Treatments   Labs (all labs ordered are listed, but only abnormal results are displayed) Labs Reviewed  CBC WITH DIFFERENTIAL/PLATELET - Abnormal; Notable for the following components:      Result Value   WBC 16.6 (*)    MCHC 36.1 (*)    Neutro Abs 14.0 (*)    Monocytes Absolute 1.4 (*)    Abs Immature Granulocytes 0.11 (*)    All other components within normal limits  SEDIMENTATION RATE - Abnormal; Notable for the following components:   Sed Rate 94 (*)    All other components within normal limits  C-REACTIVE PROTEIN - Abnormal; Notable for the following components:   CRP 20.8 (*)    All other components within normal limits  COMPREHENSIVE METABOLIC PANEL - Abnormal; Notable for the  following components:   Sodium 133 (*)    Glucose, Bld 135 (*)    Calcium 8.7 (*)    AST 81 (*)    ALT 68 (*)    All other components within normal limits  CULTURE, BLOOD (ROUTINE X 2)  CULTURE, BLOOD (ROUTINE X 2)    EKG None  Radiology DG Ankle Complete Right  Result Date: 03/11/2022 CLINICAL DATA:  Pain, swelling EXAM: RIGHT ANKLE - COMPLETE 3+ VIEW COMPARISON:  None Available. FINDINGS: No acute bony abnormality. Specifically, no fracture, subluxation, or dislocation. 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 Joint spaces are maintained. Soft tissues are intact. IMPRESSION: No acute bony abnormality. Electronically Signed   By: Charlett Nose M.D.   On: 03/11/2022 19:22    Procedures Procedures    Medications Ordered in ED Medications  HYDROcodone-acetaminophen (NORCO/VICODIN) 5-325 MG per tablet 1 tablet (1 tablet Oral Given 03/11/22 2122)    ED Course/ Medical Decision Making/ A&P Clinical Course as of 03/11/22 2333  Sat Mar 11, 2022  2258 Sed Rate(!): 94 [JS]  2258 WBC(!): 16.6 [JS]    Clinical Course User Index [JS] Claude Manges, PA-C                             Medical Decision Making Amount and/or Complexity of Data Reviewed Labs: ordered. Decision-making details documented in ED Course. Radiology: ordered.  Risk Prescription drug management. Decision regarding hospitalization.   This patient presents to the ED for concern of bilateral ankle pain, this involves a number of treatment options, and is a complaint that carries with it a high risk of complications and morbidity.  The differential diagnosis includes trauma, septic joint versus gout.    Co morbidities: Discussed in HPI   Brief History:  Patient   EMR reviewed including pt PMHx, past surgical history and past visits to ER.   See HPI for more details  Lab Tests:  I ordered and independently interpreted labs.  The pertinent results include:    Labs notable  for  Imaging Studies:  NAD. I personally reviewed all imaging studies and no acute abnormality found. I agree with radiology interpretation.  Medicines ordered:  I ordered medication including Norco  for pain control Reevaluation of the patient after these medicines showed that the patient improved I have reviewed the patients home medicines and have made adjustments as needed  Consults:  I requested consultation with Dr. Carola Frost orthopedist on call,  and discussed lab and  imaging findings as well as pertinent plan - they recommend: blood cultures, concern for systemic versus gout. However, patient unable to   Reevaluation:  After the interventions noted above I re-evaluated patient and found that they have :stayed the same   Social Determinants of Health:  The patient's social determinants of health were a factor in the care of this patient  Problem List / ED Course:  Patient presents to the ED with a chief complaint of bilateral ankle pain ongoing for 3 days.  Patient reports he was seen with left wrist pain a couple of days ago diagnosed with a radial tenosynovitis.  And placed in a brace.  On today's visit both of his ankles appear erythematous, are hot to the touch and he has a very difficult exam with plantarflexion and dorsiflexion.  Labs on today's visit with some mild hyponatremia.  Creatinine levels within normal limits.  LFTs are slightly elevated however no alcohol use or abuse noted.  CBC with a leukocytosis of 16,000, he has been getting steroids.  Sed rate is elevated at 94, CRP 20.8.  Given Norco for pain control. He denies any trauma, x-ray without any fracture or acute finding noted.  Blood cultures were also ordered.  I discussed case with Dr. Marcelino Scot of orthopedic surgery, he recommended ruling out systemic infection versus gout.  Blood cultures were obtained.  I did try to ambulate patient with staff, however patient is in severe pain to ambulate, he arrived via  wheelchair.  This is a normal gentleman who currently works daily, he is concerned for his return back to work.  I do not feel that patient is safe to go home at this time as he is having some polyarthralgias.  No fever here however there was concern for septic joint.  I discussed case with Dr. Myna Hidalgo, hospitalist who will admit patient for further management.   Dispostion:  After consideration of the diagnostic results and the patients response to treatment, I feel that the patent would benefit from admission for further management of ongoing bilateral ankle pain.     Portions of this note were generated with [employer]. Dictation errors may occur despite best attempts at proofreading.   Final Clinical Impression(s) / ED Diagnoses Final diagnoses:  Acute bilateral ankle pain  Polyarthralgia    Rx / DC Orders ED Discharge Orders     None         Janeece Fitting, PA-C 03/11/22 2333    Audley Hose, MD 03/11/22 2355

## 2022-03-11 NOTE — Progress Notes (Signed)
Plan of Care Note for accepted transfer   Patient: Lavelle Akel MRN: [id number]   DOA: 03/11/2022  Facility requesting transfer: [facility]   Requesting Provider: Janeece Fitting, PA   Reason for transfer: Polyarthralgia, now unable to walk   Facility course: 52 yr old man with hx of chronic back pain presents with 3 days of atraumatic pain, swelling, and warmth involving right ankle, and also pain in left ankle and wrist. He was seen at urgent care for this on 1/11, was started on prednisone, but continuous to have worsening sxs, particularly in the right ankle. He is now unable to walk d/t this.   He is afebrile with stable vitals. Labs notable for WBC 16.6, AST 81, ALT 68, and ESR 94. Plain films of right ankle are negative.   ED PA spoke with Dr. Marcelino Scot of orthopedic surgery who raised concern for systemic infection and recommended blood cultures.   Plan of care: The patient is accepted for admission to [facility]  unit, at [facility].   Author: Vianne Bulls, MD 03/11/2022  Check [email or web address] for on-call coverage.  Nursing staff, Please call [place] number on Amion as soon as patient's arrival, so appropriate admitting provider can evaluate the pt.

## 2022-03-11 NOTE — ED Triage Notes (Signed)
PT POV in wheelchair- c/o R ankle pain x3 days. Denies fall or injury. Reports swelling, warm to touch. Pain worse with movement. CMS intact.

## 2022-03-12 ENCOUNTER — Encounter (HOSPITAL_COMMUNITY): Payer: Self-pay | Admitting: Family Medicine

## 2022-03-12 ENCOUNTER — Other Ambulatory Visit: Payer: Self-pay

## 2022-03-12 DIAGNOSIS — R945 Abnormal results of liver function studies: Secondary | ICD-10-CM | POA: Diagnosis not present

## 2022-03-12 DIAGNOSIS — M25572 Pain in left ankle and joints of left foot: Secondary | ICD-10-CM | POA: Diagnosis not present

## 2022-03-12 DIAGNOSIS — D72829 Elevated white blood cell count, unspecified: Secondary | ICD-10-CM | POA: Diagnosis not present

## 2022-03-12 DIAGNOSIS — R7989 Other specified abnormal findings of blood chemistry: Secondary | ICD-10-CM | POA: Diagnosis present

## 2022-03-12 DIAGNOSIS — M2550 Pain in unspecified joint: Secondary | ICD-10-CM | POA: Diagnosis not present

## 2022-03-12 DIAGNOSIS — M25571 Pain in right ankle and joints of right foot: Secondary | ICD-10-CM | POA: Diagnosis present

## 2022-03-12 LAB — URINALYSIS, ROUTINE W REFLEX MICROSCOPIC
Bacteria, UA: NONE SEEN
Bilirubin Urine: NEGATIVE
Glucose, UA: NEGATIVE mg/dL
Hgb urine dipstick: NEGATIVE
Ketones, ur: NEGATIVE mg/dL
Leukocytes,Ua: NEGATIVE
Nitrite: NEGATIVE
Protein, ur: 30 mg/dL — AB
Specific Gravity, Urine: 1.025 (ref 1.005–1.030)
pH: 5 (ref 5.0–8.0)

## 2022-03-12 LAB — COMPREHENSIVE METABOLIC PANEL
ALT: 61 U/L — ABNORMAL HIGH (ref 0–44)
AST: 54 U/L — ABNORMAL HIGH (ref 15–41)
Albumin: 3.6 g/dL (ref 3.5–5.0)
Alkaline Phosphatase: 63 U/L (ref 38–126)
Anion gap: 10 (ref 5–15)
BUN: 15 mg/dL (ref 6–20)
CO2: 23 mmol/L (ref 22–32)
Calcium: 8.7 mg/dL — ABNORMAL LOW (ref 8.9–10.3)
Chloride: 103 mmol/L (ref 98–111)
Creatinine, Ser: 0.91 mg/dL (ref 0.61–1.24)
GFR, Estimated: >60 mL/min (ref >60)
Glucose, Bld: 116 mg/dL — ABNORMAL HIGH (ref 70–99)
Potassium: 3.6 mmol/L (ref 3.5–5.1)
Sodium: 136 mmol/L (ref 135–145)
Total Bilirubin: 1.3 mg/dL — ABNORMAL HIGH (ref 0.3–1.2)
Total Protein: 7.6 g/dL (ref 6.5–8.1)

## 2022-03-12 LAB — CBC WITH DIFFERENTIAL/PLATELET
Abs Immature Granulocytes: 0.05 10*3/uL (ref 0.00–0.07)
Basophils Absolute: 0 10*3/uL (ref 0.0–0.1)
Basophils Relative: 0 %
Eosinophils Absolute: 0 10*3/uL (ref 0.0–0.5)
Eosinophils Relative: 0 %
HCT: 37.2 % — ABNORMAL LOW (ref 39.0–52.0)
Hemoglobin: 13.2 g/dL (ref 13.0–17.0)
Immature Granulocytes: 0 %
Lymphocytes Relative: 13 %
Lymphs Abs: 1.8 10*3/uL (ref 0.7–4.0)
MCH: 32.1 pg (ref 26.0–34.0)
MCHC: 35.5 g/dL (ref 30.0–36.0)
MCV: 90.5 fL (ref 80.0–100.0)
Monocytes Absolute: 1.5 10*3/uL — ABNORMAL HIGH (ref 0.1–1.0)
Monocytes Relative: 11 %
Neutro Abs: 10.7 10*3/uL — ABNORMAL HIGH (ref 1.7–7.7)
Neutrophils Relative %: 76 %
Platelets: 312 10*3/uL (ref 150–400)
RBC: 4.11 MIL/uL — ABNORMAL LOW (ref 4.22–5.81)
RDW: 12 % (ref 11.5–15.5)
WBC: 14.1 10*3/uL — ABNORMAL HIGH (ref 4.0–10.5)
nRBC: 0 % (ref 0.0–0.2)

## 2022-03-12 LAB — HEPATITIS PANEL, ACUTE
HCV Ab: NONREACTIVE
Hep A IgM: NONREACTIVE
Hep B C IgM: NONREACTIVE
Hepatitis B Surface Ag: NONREACTIVE

## 2022-03-12 LAB — MAGNESIUM: Magnesium: 2.5 mg/dL — ABNORMAL HIGH (ref 1.7–2.4)

## 2022-03-12 MED ORDER — MELATONIN 3 MG PO TABS: 3.0000 mg | ORAL_TABLET | Freq: Every evening | ORAL | Status: DC | PRN

## 2022-03-12 MED ORDER — HYDROCODONE-ACETAMINOPHEN 5-325 MG PO TABS
1.0000 | ORAL_TABLET | Freq: Four times a day (QID) | ORAL | Status: DC | PRN
  Administered 2022-03-12: 1 via ORAL
  Filled 2022-03-12: qty 1

## 2022-03-12 MED ORDER — ACETAMINOPHEN 325 MG PO TABS: 650.0000 mg | ORAL_TABLET | Freq: Four times a day (QID) | ORAL | Status: DC | PRN

## 2022-03-12 MED ORDER — OXYCODONE HCL 5 MG PO TABS
5.0000 mg | ORAL_TABLET | Freq: Four times a day (QID) | ORAL | Status: DC | PRN
  Administered 2022-03-13: 5 mg via ORAL
  Filled 2022-03-12 (×2): qty 1

## 2022-03-12 MED ORDER — PANTOPRAZOLE SODIUM 40 MG PO TBEC
40.0000 mg | DELAYED_RELEASE_TABLET | Freq: Every day | ORAL | Status: DC
  Administered 2022-03-12 – 2022-03-13 (×2): 40 mg via ORAL
  Filled 2022-03-12 (×2): qty 1

## 2022-03-12 MED ORDER — NALOXONE HCL 0.4 MG/ML IJ SOLN: 0.4000 mg | INTRAMUSCULAR | Status: DC | PRN

## 2022-03-12 MED ORDER — PREDNISONE 10 MG (21) PO TBPK
ORAL_TABLET | Freq: Every day | ORAL | Status: DC
  Administered 2022-03-12: 60 mg via ORAL
  Filled 2022-03-12: qty 21

## 2022-03-12 MED ORDER — ACETAMINOPHEN 650 MG RE SUPP: 650.0000 mg | Freq: Four times a day (QID) | RECTAL | Status: DC | PRN

## 2022-03-12 MED ORDER — KETOROLAC TROMETHAMINE 15 MG/ML IJ SOLN
15.0000 mg | Freq: Four times a day (QID) | INTRAMUSCULAR | Status: DC | PRN
  Administered 2022-03-12: 15 mg via INTRAVENOUS
  Filled 2022-03-12: qty 1

## 2022-03-12 NOTE — Progress Notes (Signed)
Carryover admission to the Day Admitter; accepted by Dr.  Myna Hidalgo as transfer from  [facility]  to a  med-surg bed at  [facility]  for acute polyarthralgia. Please see Dr.  Criss Rosales transfer progress note for additional details.   I have placed some additional preliminary admit orders via the adult multi-morbid admission order set. I have also ordered as needed Norco, prn IV Toradol.    Babs Bertin, DO Hospitalist

## 2022-03-12 NOTE — Plan of Care (Signed)
  Problem: Coping: °Goal: Level of anxiety will decrease °Outcome: Progressing °  °

## 2022-03-12 NOTE — Plan of Care (Signed)
POC initiated 

## 2022-03-12 NOTE — H&P (Signed)
History and Physical    Patient: Darryl Reid JYN:[id number] DOB: [birth year] DOA: 03/11/2022 DOS: the patient was seen and examined on 03/12/2022 PCP: Pcp, No  Patient coming from: Home  Chief Complaint:  Chief Complaint  Patient presents with   Ankle Pain   HPI: Darryl Reid is a 52 y.o. male with no previous past medical history who is coming to the emergency department due to left wrist and bilateral ankle pain.  He was seen at a urgent care on 03/09/2022 and was given prednisone after being seen by orthopedic surgery who diagnosed her with tenosynovitis of the left wrist.  He has not had significant relief from it.  He works with his hands and at least twice a week walks from around [employer] to the [employer] area roundtrip.  He is unable to bear weight on his ankles without significant tenderness.  He was taken from the private vehicle to the emergency department in the wheelchair. He denied fever, chills, rhinorrhea, sore throat, wheezing or hemoptysis.  No chest pain, palpitations, diaphoresis, PND, orthopnea or pitting edema of the lower extremities.  No abdominal pain, nausea, emesis, diarrhea, constipation, melena or hematochezia.  No flank pain, dysuria, frequency or hematuria.  No polyuria, polydipsia, polyphagia or blurred vision.   ED course: Initial vital signs were temperature 98.9 F, pulse 83, respirations 17, BP 132/83 mmHg O2 sat 100% on room air.  The patient received a tablet oral Norco 5/325 mg p.o. x 1.  Lab work: CBC showed a white count of 16.6, hemoglobin 14.1 g/dL platelets 290.  CMP with a sodium 133 mmol/L, the rest of the electrolytes and renal function were normal after calcium correction.  Glucose 135 mg/dL.  ALT was 68 and AST 81 with a total bilirubin 1.0 mg/dL.  On today's follow-up ALT was 61 and AST 54 units/L with a total bilirubin level 1.3 mg/dL.  Imaging: Right ankle x-ray with no acute abnormality.   Review of Systems: As mentioned  in the history of present illness. All other systems reviewed and are negative.  History reviewed. No pertinent past medical history. History reviewed.  Surgical history is significant for vasectomy. Social History:  reports that he has never smoked. He does not have any smokeless tobacco history on file. He reports current alcohol use. He reports that he does not use drugs.  Not on File  History reviewed. No pertinent family history.  Prior to Admission medications   Medication Sig Start Date End Date Taking? Authorizing Provider  acetaminophen (TYLENOL) 325 MG tablet Take 650 mg by mouth every 6 (six) hours as needed.    [provider]  ibuprofen (ADVIL,MOTRIN) 800 MG tablet Take 1 tablet (800 mg total) by mouth 3 (three) times daily. 04/29/18   Zigmund Gottron, NP  predniSONE (STERAPRED UNI-PAK 21 TAB) 10 MG (21) TBPK tablet Take by mouth daily. Take 6 tabs by mouth daily  for 1 days, then 5 tabs for 1 days, then 4 tabs for 1 days, then 3 tabs for 1 days, 2 tabs for 1 days, then 1 tab by mouth daily for 1 days 03/09/22   Chaney Malling, [place]    Physical Exam: Vitals:   03/12/22 0215 03/12/22 0259 03/12/22 0605 03/12/22 0758  BP: (!) 145/90 (!) 143/76 133/79 117/88  Pulse: 62 67 94 89  Resp: 17 14 19 18   Temp: 98.8 F (37.1 C) 99.2 F (37.3 C) 98.6 F (37 C) 99.3 F (37.4 C)  TempSrc: Oral Oral    SpO2: 99% 99% 100% 98%  Weight:      Height:       Physical Exam Vitals and nursing note reviewed.  Constitutional:      General: He is awake. He is not in acute distress.    Appearance: He is normal weight.  HENT:     Head: Normocephalic.     Nose: No rhinorrhea.     Mouth/Throat:     Mouth: Mucous membranes are moist.  Eyes:     General: No scleral icterus.    Pupils: Pupils are equal, round, and reactive to light.  Cardiovascular:     Rate and Rhythm: Normal rate and regular rhythm.  Pulmonary:     Effort: Pulmonary effort is normal.     Breath sounds: Normal  breath sounds.  Abdominal:     General: Bowel sounds are normal.     Palpations: Abdomen is soft.  Musculoskeletal:     Right wrist: Tenderness present. Decreased range of motion.     Cervical back: Neck supple.     Right lower leg: No edema.     Left lower leg: No edema.     Right ankle: Tenderness present. Decreased range of motion.     Left ankle: Tenderness present. Decreased range of motion.  Skin:    General: Skin is warm and dry.  Neurological:     General: No focal deficit present.     Mental Status: He is alert and oriented to person, place, and time.  Psychiatric:        Mood and Affect: Mood normal.        Behavior: Behavior normal. Behavior is cooperative.   Data Reviewed:  Results are pending, will review when available.  Assessment and Plan: Principal Problem:   Polyarthralgia Likely from repeated trauma. Observation/MedSurg. Continue analgesics as needed. Continue prednisone taper. Check ANA and rheumatoid factor. Check hepatitis panel. Follow-up inflammatory markers.  Active Problems:   Abnormal LFTs Does not drink alcohol regularly. Check acute hepatitis panel.    Leukocytosis No fever, chills or night sweats. Has been taking prednisone. Monitor white blood cell count.     Advance Care Planning:   Code Status: Full Code   Consults:   Family Communication:   Severity of Illness: The appropriate patient status for this patient is OBSERVATION. Observation status is judged to be reasonable and necessary in order to provide the required intensity of service to ensure the patient's safety. The patient's presenting symptoms, physical exam findings, and initial radiographic and laboratory data in the context of their medical condition is felt to place them at decreased risk for further clinical deterioration. Furthermore, it is anticipated that the patient will be medically stable for discharge from the hospital within 2 midnights of admission.    Author: Reubin Milan, MD 03/12/2022 8:08 AM  For on call review [email or web address].   This document was prepared using Dragon voice recognition software and may contain some unintended transcription errors.

## 2022-03-13 ENCOUNTER — Other Ambulatory Visit (HOSPITAL_COMMUNITY): Payer: Self-pay

## 2022-03-13 DIAGNOSIS — M2550 Pain in unspecified joint: Secondary | ICD-10-CM | POA: Diagnosis not present

## 2022-03-13 LAB — CULTURE, BLOOD (ROUTINE X 2): Special Requests: ADEQUATE

## 2022-03-13 LAB — ANA W/REFLEX IF POSITIVE: Anti Nuclear Antibody (ANA): NEGATIVE

## 2022-03-13 MED ORDER — IBUPROFEN 800 MG PO TABS: 800.0000 mg | ORAL_TABLET | Freq: Three times a day (TID) | ORAL | 0 refills | Status: DC | PRN

## 2022-03-13 MED ORDER — PANTOPRAZOLE SODIUM 40 MG PO TBEC
40.0000 mg | DELAYED_RELEASE_TABLET | Freq: Every day | ORAL | 0 refills | Status: DC
  Filled 2022-03-13: qty 14, 14d supply, fill #0

## 2022-03-13 MED ORDER — OXYCODONE HCL 5 MG PO TABS
5.0000 mg | ORAL_TABLET | Freq: Four times a day (QID) | ORAL | 0 refills | Status: DC | PRN
  Filled 2022-03-13: qty 10, 3d supply, fill #0

## 2022-03-13 MED ORDER — PREDNISONE 10 MG PO TABS
10.0000 mg | ORAL_TABLET | Freq: Every day | ORAL | 0 refills | Status: DC
  Filled 2022-03-13: qty 24, 8d supply, fill #0

## 2022-03-13 NOTE — TOC CM/SW Note (Signed)
Transition of Care [facility]) Screening Note  Patient Details  Name: Maveryk Renstrom Bi Imburgia Date of Birth: [birth year]  Transition of Care [facility]) CM/SW Contact:    Sherie Don, LCSW Phone Number: 03/13/2022, 10:41 AM  Transition of Care Department [facility]) has reviewed patient and no TOC needs have been identified at this time. We will continue to monitor patient advancement through interdisciplinary progression rounds. If new patient transition needs arise, please place a TOC consult.

## 2022-03-13 NOTE — Plan of Care (Signed)

## 2022-03-13 NOTE — Evaluation (Signed)
Physical Therapy Evaluation Patient Details Name: Darryl Reid MRN: [id number] DOB: [birth year] Today's Date: 03/13/2022  History of Present Illness  52 y.o. male with no previous past medical history who is coming to the emergency department due to left wrist and bilateral ankle pain.  He was seen at a urgent care on 03/09/2022 and was given prednisone after being seen by orthopedic surgery who diagnosed her with tenosynovitis of the left wrist, also with bil ankle pain and tenderness. pt to f/u with RA clinic at d/c  Clinical Impression  Patient evaluated by Physical Therapy with no further acute PT needs identified. All education has been completed and the patient has no further questions.  Pt still with significant pain with WBing on R LE. L ankle and wrist improved. Pt is heavily reliant on RW, will need walker for home.   See below for any follow-up Physical Therapy or equipment needs. PT is signing off. Thank you for this referral.        Recommendations for follow up therapy are one component of a multi-disciplinary discharge planning process, led by the attending physician.  Recommendations may be updated based on patient status, additional functional criteria and insurance authorization.  Follow Up Recommendations No PT follow up      Assistance Recommended at Discharge PRN  Patient can return home with the following  Help with stairs or ramp for entrance;A little help with walking and/or transfers    Equipment Recommendations Rolling walker (2 wheels)  Recommendations for Other Services       Functional Status Assessment Patient has had a recent decline in their functional status and demonstrates the ability to make significant improvements in function in a reasonable and predictable amount of time.     Precautions / Restrictions Precautions Precautions: None Restrictions Weight Bearing Restrictions: No      Mobility  Bed Mobility                General bed mobility comments: in chair on arrival    Transfers Overall transfer level: Needs assistance Equipment used: Rolling walker (2 wheels) Transfers: Sit to/from Stand Sit to Stand: Supervision                Ambulation/Gait Ambulation/Gait assistance: Supervision Gait Distance (Feet): 160 Feet Assistive device: Rolling walker (2 wheels) Gait Pattern/deviations: Step-to pattern, Step-through pattern, Decreased stance time - right       General Gait Details: decr joint excursion bil, moderate to heavy reliance on RW.  significantly antalgic  gait without UE support x 5 '-min/guard assist for safety  Stairs            Wheelchair Mobility    Modified Rankin (Stroke Patients Only)       Balance Overall balance assessment: Mild deficits observed, not formally tested (d/t pain with WBing)                                           Pertinent Vitals/Pain Pain Assessment Pain Assessment: Faces Faces Pain Scale: Hurts little more Pain Location: R ankle > L Pain Descriptors / Indicators: Grimacing Pain Intervention(s): Limited activity within patient's tolerance, Monitored during session    Home Living Family/patient expects to be discharged to:: Private residence Living Arrangements: Spouse/significant other Available Help at Discharge: Family Type of Home: Apartment Home Access: Stairs to enter   [employer] of Steps: 14  Home Layout: One level Home Equipment: None      Prior Function Prior Level of Function : Independent/Modified Independent;Working/employed                     Hand Dominance        Extremity/Trunk Assessment   Upper Extremity Assessment Upper Extremity Assessment: Overall WFL for tasks assessed    Lower Extremity Assessment Lower Extremity Assessment: Overall WFL for tasks assessed;RLE deficits/detail RLE Deficits / Details: ankle ROM painful BUT wfl       Communication    Communication: No difficulties  Cognition Arousal/Alertness: Awake/alert Behavior During Therapy: WFL for tasks assessed/performed Overall Cognitive Status: Within Functional Limits for tasks assessed                                          General Comments      Exercises     Assessment/Plan    PT Assessment Patient does not need any further PT services  PT Problem List         PT Treatment Interventions      PT Goals (Current goals can be found in the Care Plan section)  Acute Rehab PT Goals Patient Stated Goal: less pain, back to work PT Goal Formulation: All assessment and education complete, DC therapy    Frequency       Co-evaluation               AM-PAC PT "6 Clicks" Mobility  Outcome Measure Help needed turning from your back to your side while in a flat bed without using bedrails?: None Help needed moving from lying on your back to sitting on the side of a flat bed without using bedrails?: None Help needed moving to and from a bed to a chair (including a wheelchair)?: None Help needed standing up from a chair using your arms (e.g., wheelchair or bedside chair)?: None Help needed to walk in hospital room?: A Little Help needed climbing 3-5 steps with a railing? : A Little 6 Click Score: 22    End of Session Equipment Utilized During Treatment: Gait belt Activity Tolerance: Patient tolerated treatment well Patient left: in chair   PT Visit Diagnosis: Unsteadiness on feet (R26.81)    Time: 8588-5027 PT Time Calculation (min) (ACUTE ONLY): 12 min   Charges:              Baxter Flattery, PT  Acute Rehab Dept [facility]) [phone]  [facility] Weekend Pager (Saturday/Sunday only)  [phone]  03/13/2022   [facility] 03/13/2022, 11:17 AM

## 2022-03-13 NOTE — Discharge Summary (Signed)
Physician Discharge Summary  Darryl Reid EPP:[id number] DOB: 1970-03-14 DOA: 03/11/2022  PCP: Pcp, No  Admit date: 03/11/2022 Discharge date: 03/13/2022  Admitted From: Home Disposition:  Home  Discharge Condition:Stable CODE STATUS:FULL Diet recommendation: Regular   Brief/Interim Summary:  Patient is a 52 year old male with no past medical history who presents from home with complaint of persistent left wrist and bilateral ankle pain and swelling.  He was recently seen in urgent care on 1/11 and was given prednisone after being diagnosed with tenosynovitis of the left wrist.  No significant relief so he presented to the emergency department.  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 On presentation.  He was hemodynamically stable.  Lab work showed WC count of 16.6.  Liver enzymes showed mild elevated.  CRP, ESR elevated.  Patient was admitted for the management of polyarthralgia likely from underlying inflammatory disease.  Started on prednisone. Patient seen and examined at the bedside today.  He looks very comfortable today.  He does not have any ankle pain or edema today.  His left wrist pain and edema also significantly improved.  No problem with ambulation. Patient will be discharged on tapering steroid and pain management.  He should follow-up with [facility] rheumatology as an outpatient.  Ambulatory referral provided.  Patient also instructed to call and get an appointment.  He should also follow-up with his PCP in a week and do a CBC test to check his leukocytosis which is most likely contributed by steroids.  Medically stable for discharge.  He can continue wrist splint at the meantime.   Discharge Diagnoses:  Principal Problem:   Polyarthralgia Active Problems:   Abnormal LFTs   Leukocytosis    Discharge Instructions  Discharge Instructions     Ambulatory referral to Rheumatology   Complete by: As directed    Diet general   Complete by: As directed     Discharge instructions   Complete by: As directed    1)Please take prescribed medications as instructed 2)Follow up with rheumatology as an outpatient in a week.  Name and number the provider group has been attached 3)Follow up with your PCP in a week.  Do a CBC test to check your WBC count during the follow up   Increase activity slowly   Complete by: As directed       Allergies as of 03/13/2022   No Known Allergies      Medication List     STOP taking these medications    predniSONE 10 MG (21) Tbpk tablet Commonly known as: STERAPRED UNI-PAK 21 TAB Replaced by: predniSONE 10 MG tablet       TAKE these medications    acetaminophen 325 MG tablet Commonly known as: TYLENOL Take 650 mg by mouth every 6 (six) hours as needed for mild pain.   ibuprofen 800 MG tablet Commonly known as: ADVIL Take 1 tablet (800 mg total) by mouth every 8 (eight) hours as needed. What changed:  when to take this reasons to take this   oxyCODONE 5 MG immediate release tablet Commonly known as: Oxy IR/ROXICODONE Take 1 tablet (5 mg total) by mouth every 6 (six) hours as needed for moderate pain or breakthrough pain.   pantoprazole 40 MG tablet Commonly known as: PROTONIX Take 1 tablet (40 mg total) by mouth daily for 14 days.   predniSONE 10 MG tablet Commonly known as: DELTASONE Take 1 tablet (10 mg total) by mouth daily. Take 5 pills daily for  2 days then 4 pills daily for 2 days then 2 pills daily for 2 days then 1 pill daily for 2 days then stop Replaces: predniSONE 10 MG (21) Tbpk tablet        Follow-up Information     [facility] [place]. [facility] [employer]. Schedule an appointment as soon as possible for a visit in 1 week(s).   Specialty: Rheumatology Contact information: [address] [place] [address] [phone]               No Known Allergies  Consultations: None   Procedures/Studies: DG Ankle  Complete Right  Result Date: 03/11/2022 CLINICAL DATA:  Pain, swelling EXAM: RIGHT ANKLE - COMPLETE 3+ VIEW COMPARISON:  None Available. FINDINGS: No acute bony abnormality. Specifically, no fracture, subluxation, or dislocation. 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 Joint spaces are maintained. Soft tissues are intact. IMPRESSION: No acute bony abnormality. Electronically Signed   By: Charlett Nose M.D.   On: 03/11/2022 19:22      Subjective: Patient seen and examined at bedside today.  Hemodynamically stable for discharge  Discharge Exam: Vitals:   03/13/22 0022 03/13/22 0515  BP: 115/69 136/80  Pulse: 71 79  Resp: 16 16  Temp: 98.1 F (36.7 C) 98.9 F (37.2 C)  SpO2: 100% 99%   Vitals:   03/12/22 1758 03/12/22 2107 03/13/22 0022 03/13/22 0515  BP: 123/79 115/79 115/69 136/80  Pulse: 90 81 71 79  Resp: 16 18 16 16   Temp: (!) 100.5 F (38.1 C) 98.5 F (36.9 C) 98.1 F (36.7 C) 98.9 F (37.2 C)  TempSrc: Oral Oral Oral Oral  SpO2: 97% 99% 100% 99%  Weight:      Height:        General: Pt is alert, awake, not in acute distress Cardiovascular: RRR, S1/S2 +, no rubs, no gallops Respiratory: CTA bilaterally, no wheezing, no rhonchi Abdominal: Soft, NT, ND, bowel sounds + Extremities: no edema, no cyanosis    The results of significant diagnostics from this hospitalization (including imaging, microbiology, ancillary and laboratory) are listed below for reference.     Microbiology: No results found for this or any previous visit (from the past 240 hour(s)).   Labs: BNP (last 3 results) No results for input(s): "BNP" in the last 8760 hours. Basic Metabolic Panel: Recent Labs  Lab 03/11/22 2037 03/12/22 0401  NA 133* 136  K 3.7 3.6  CL 100 103  CO2 22 23  GLUCOSE 135* 116*  BUN 14 15  CREATININE 1.01 0.91  CALCIUM 8.7* 8.7*  MG  --  2.5*   Liver Function Tests: Recent Labs  Lab 03/11/22 2037 03/12/22 0401  AST 81* 54*  ALT 68*  61*  ALKPHOS 75 63  BILITOT 1.0 1.3*  PROT 8.1 7.6  ALBUMIN 3.5 3.6   No results for input(s): "LIPASE", "AMYLASE" in the last 168 hours. No results for input(s): "AMMONIA" in the last 168 hours. CBC: Recent Labs  Lab 03/11/22 1947 03/12/22 0401  WBC 16.6* 14.1*  NEUTROABS 14.0* 10.7*  HGB 14.1 13.2  HCT 39.1 37.2*  MCV 90.3 90.5  PLT 290 312   Cardiac Enzymes: No results for input(s): "CKTOTAL", "CKMB", "CKMBINDEX", "TROPONINI" in the last 168 hours. BNP: Invalid input(s): "POCBNP" CBG: No results for input(s): "GLUCAP" in the last 168 hours. D-Dimer No results for input(s): "DDIMER" in the last 72 hours. Hgb A1c No results for input(s): "HGBA1C"  in the last 72 hours. Lipid Profile No results for input(s): "CHOL", "HDL", "LDLCALC", "TRIG", "CHOLHDL", "LDLDIRECT" in the last 72 hours. Thyroid function studies No results for input(s): "TSH", "T4TOTAL", "T3FREE", "THYROIDAB" in the last 72 hours.  Invalid input(s): "FREET3" Anemia work up No results for input(s): "VITAMINB12", "FOLATE", "FERRITIN", "TIBC", "IRON", "RETICCTPCT" in the last 72 hours. Urinalysis    Component Value Date/Time   COLORURINE AMBER (A) 03/12/2022 1715   APPEARANCEUR CLEAR 03/12/2022 1715   LABSPEC 1.025 03/12/2022 1715   PHURINE 5.0 03/12/2022 1715   GLUCOSEU NEGATIVE 03/12/2022 1715   HGBUR NEGATIVE 03/12/2022 [place] 03/12/2022 [place] 03/12/2022 1715   PROTEINUR 30 (A) 03/12/2022 1715   NITRITE NEGATIVE 03/12/2022 [place] 03/12/2022 1715   Sepsis Labs Recent Labs  Lab 03/11/22 1947 03/12/22 0401  WBC 16.6* 14.1*   Microbiology No results found for this or any previous visit (from the past 240 hour(s)).  Please note: You were cared for by a hospitalist during your hospital stay. Once you are discharged, your primary care physician will handle any further medical issues. Please note that NO REFILLS for any discharge  medications will be authorized once you are discharged, as it is imperative that you return to your primary care physician (or establish a relationship with a primary care physician if you do not have one) for your post hospital discharge needs so that they can reassess your need for medications and monitor your lab values.    Time coordinating discharge: 40 minutes  SIGNED:   Shelly Coss, MD  [facility] 03/13/2022, 10:37 AM Pager [phone]  If 7PM-7AM, please contact night-coverage [email or web address] Password [email or web address]

## 2022-03-13 NOTE — TOC Transition Note (Signed)
Transition of Care [facility]) - CM/SW Discharge Note  Patient Details  Name: Darryl Reid MRN: [id number] Date of Birth: 07/06/1970  Transition [facility]) CM/SW Contact:  Sherie Don, LCSW Phone Number: 03/13/2022, 2:22 PM  Clinical Narrative: PT evaluated patient and recommended a rolling walker. Patient agreeable to [employer] delivering walker to room. CSW made DME referral to [facility] with [employer]. [employer] to deliver walker to room. TOC signing off.   Final next level of care: Home/Self Care Barriers to Discharge: No Barriers Identified  Patient Goals and CMS Choice CMS Medicare.gov Compare Post Acute Care list provided to:: Patient Choice offered to / list presented to : Patient  Discharge Plan and Services Additional resources added to the After Visit Summary for         DME Arranged: Walker rolling DME Agency: [employer] Date DME Agency Contacted: 03/13/22 Representative spoke with at DME Agency: [place] Interventions SDOH Screenings   Food Insecurity: No Food Insecurity (03/12/2022)  Housing: Low Risk  (03/12/2022)  Transportation Needs: No Transportation Needs (03/12/2022)  Utilities: Not At Risk (03/12/2022)  Tobacco Use: Unknown (03/12/2022)   Readmission Risk Interventions     No data to display

## 2022-03-14 LAB — RHEUMATOID FACTOR: Rheumatoid fact SerPl-aCnc: 34.7 IU/mL — ABNORMAL HIGH (ref <14.0)

## 2022-03-17 LAB — CULTURE, BLOOD (ROUTINE X 2)
Culture: NO GROWTH
Culture: NO GROWTH
Special Requests: ADEQUATE

## 2022-03-28 ENCOUNTER — Encounter (HOSPITAL_COMMUNITY): Payer: Self-pay

## 2022-03-28 ENCOUNTER — Ambulatory Visit (HOSPITAL_COMMUNITY)
Admission: RE | Admit: 2022-03-28 | Discharge: 2022-03-28 | Disposition: A | Discharge status: Home / Self Care | Payer: Medicaid Other | Source: Ambulatory Visit | Attending: Family Medicine | Admitting: Family Medicine

## 2022-03-28 VITALS — BP 122/83 | HR 111 | Temp 99.2°F | Resp 18

## 2022-03-28 DIAGNOSIS — M25572 Pain in left ankle and joints of left foot: Secondary | ICD-10-CM | POA: Diagnosis not present

## 2022-03-28 DIAGNOSIS — R531 Weakness: Secondary | ICD-10-CM | POA: Insufficient documentation

## 2022-03-28 DIAGNOSIS — M25571 Pain in right ankle and joints of right foot: Secondary | ICD-10-CM | POA: Diagnosis not present

## 2022-03-28 DIAGNOSIS — M25532 Pain in left wrist: Secondary | ICD-10-CM | POA: Insufficient documentation

## 2022-03-28 DIAGNOSIS — R5383 Other fatigue: Secondary | ICD-10-CM | POA: Diagnosis not present

## 2022-03-28 LAB — COMPREHENSIVE METABOLIC PANEL
ALT: 100 U/L — ABNORMAL HIGH (ref 0–44)
AST: 62 U/L — ABNORMAL HIGH (ref 15–41)
Albumin: 2.6 g/dL — ABNORMAL LOW (ref 3.5–5.0)
Alkaline Phosphatase: 173 U/L — ABNORMAL HIGH (ref 38–126)
Anion gap: 14 (ref 5–15)
BUN: 14 mg/dL (ref 6–20)
CO2: 22 mmol/L (ref 22–32)
Calcium: 9.4 mg/dL (ref 8.9–10.3)
Chloride: 99 mmol/L (ref 98–111)
Creatinine, Ser: 0.93 mg/dL (ref 0.61–1.24)
GFR, Estimated: >60 mL/min (ref >60)
Glucose, Bld: 100 mg/dL — ABNORMAL HIGH (ref 70–99)
Potassium: 3.9 mmol/L (ref 3.5–5.1)
Sodium: 135 mmol/L (ref 135–145)
Total Bilirubin: 2.2 mg/dL — ABNORMAL HIGH (ref 0.3–1.2)
Total Protein: 7.6 g/dL (ref 6.5–8.1)

## 2022-03-28 LAB — CBC WITH DIFFERENTIAL/PLATELET
Abs Immature Granulocytes: 0.04 10*3/uL (ref 0.00–0.07)
Basophils Absolute: 0 10*3/uL (ref 0.0–0.1)
Basophils Relative: 0 %
Eosinophils Absolute: 0 10*3/uL (ref 0.0–0.5)
Eosinophils Relative: 0 %
HCT: 34.1 % — ABNORMAL LOW (ref 39.0–52.0)
Hemoglobin: 11.8 g/dL — ABNORMAL LOW (ref 13.0–17.0)
Immature Granulocytes: 1 %
Lymphocytes Relative: 20 %
Lymphs Abs: 1.6 10*3/uL (ref 0.7–4.0)
MCH: 31.5 pg (ref 26.0–34.0)
MCHC: 34.6 g/dL (ref 30.0–36.0)
MCV: 90.9 fL (ref 80.0–100.0)
Monocytes Absolute: 0.9 10*3/uL (ref 0.1–1.0)
Monocytes Relative: 11 %
Neutro Abs: 5.5 10*3/uL (ref 1.7–7.7)
Neutrophils Relative %: 68 %
Platelets: 471 10*3/uL — ABNORMAL HIGH (ref 150–400)
RBC: 3.75 MIL/uL — ABNORMAL LOW (ref 4.22–5.81)
RDW: 12.7 % (ref 11.5–15.5)
WBC: 8 10*3/uL (ref 4.0–10.5)
nRBC: 0 % (ref 0.0–0.2)

## 2022-03-28 LAB — TSH: TSH: 0.863 u[IU]/mL (ref 0.350–4.500)

## 2022-03-28 MED ORDER — PREDNISONE 10 MG (48) PO TBPK: ORAL_TABLET | ORAL | 0 refills | Status: DC

## 2022-03-28 NOTE — ED Triage Notes (Signed)
Pt c/o pain and swelling to lt hand, pain to rt elbow, and lt ankle x2 wks. States seen and tx'd at the ED with no relief. States swelling is worse.

## 2022-03-28 NOTE — Discharge Instructions (Addendum)
You have had labs (blood tests) sent today. We will call you with any significant abnormalities or if there is need to begin or change treatment or pursue further follow up.  You may also review your test results online through MyChart. If you do not have a MyChart account, instructions to sign up should be on your discharge paperwork.  

## 2022-03-29 ENCOUNTER — Emergency Department (HOSPITAL_COMMUNITY): Payer: Medicaid Other

## 2022-03-29 ENCOUNTER — Other Ambulatory Visit: Payer: Self-pay

## 2022-03-29 ENCOUNTER — Emergency Department (HOSPITAL_COMMUNITY)
Admission: EM | Admit: 2022-03-29 | Discharge: 2022-03-29 | Disposition: A | Discharge status: Home / Self Care | Payer: Medicaid Other | Attending: Emergency Medicine | Admitting: Emergency Medicine

## 2022-03-29 DIAGNOSIS — R2243 Localized swelling, mass and lump, lower limb, bilateral: Secondary | ICD-10-CM | POA: Diagnosis not present

## 2022-03-29 DIAGNOSIS — R700 Elevated erythrocyte sedimentation rate: Secondary | ICD-10-CM | POA: Diagnosis not present

## 2022-03-29 DIAGNOSIS — M7989 Other specified soft tissue disorders: Secondary | ICD-10-CM | POA: Diagnosis not present

## 2022-03-29 DIAGNOSIS — M25572 Pain in left ankle and joints of left foot: Secondary | ICD-10-CM | POA: Insufficient documentation

## 2022-03-29 DIAGNOSIS — R768 Other specified abnormal immunological findings in serum: Secondary | ICD-10-CM | POA: Insufficient documentation

## 2022-03-29 DIAGNOSIS — M79642 Pain in left hand: Secondary | ICD-10-CM | POA: Diagnosis not present

## 2022-03-29 DIAGNOSIS — M059 Rheumatoid arthritis with rheumatoid factor, unspecified: Secondary | ICD-10-CM | POA: Diagnosis not present

## 2022-03-29 LAB — CBC WITH DIFFERENTIAL/PLATELET
Abs Immature Granulocytes: 0.03 10*3/uL (ref 0.00–0.07)
Basophils Absolute: 0 10*3/uL (ref 0.0–0.1)
Basophils Relative: 0 %
Eosinophils Absolute: 0 10*3/uL (ref 0.0–0.5)
Eosinophils Relative: 1 %
HCT: 34.6 % — ABNORMAL LOW (ref 39.0–52.0)
Hemoglobin: 11.6 g/dL — ABNORMAL LOW (ref 13.0–17.0)
Immature Granulocytes: 1 %
Lymphocytes Relative: 17 %
Lymphs Abs: 1.1 10*3/uL (ref 0.7–4.0)
MCH: 30.9 pg (ref 26.0–34.0)
MCHC: 33.5 g/dL (ref 30.0–36.0)
MCV: 92 fL (ref 80.0–100.0)
Monocytes Absolute: 0.8 10*3/uL (ref 0.1–1.0)
Monocytes Relative: 13 %
Neutro Abs: 4.6 10*3/uL (ref 1.7–7.7)
Neutrophils Relative %: 68 %
Platelets: 463 10*3/uL — ABNORMAL HIGH (ref 150–400)
RBC: 3.76 MIL/uL — ABNORMAL LOW (ref 4.22–5.81)
RDW: 12.8 % (ref 11.5–15.5)
WBC: 6.6 10*3/uL (ref 4.0–10.5)
nRBC: 0 % (ref 0.0–0.2)

## 2022-03-29 LAB — COMPREHENSIVE METABOLIC PANEL
ALT: 100 U/L — ABNORMAL HIGH (ref 0–44)
AST: 71 U/L — ABNORMAL HIGH (ref 15–41)
Albumin: 2.6 g/dL — ABNORMAL LOW (ref 3.5–5.0)
Alkaline Phosphatase: 171 U/L — ABNORMAL HIGH (ref 38–126)
Anion gap: 11 (ref 5–15)
BUN: 15 mg/dL (ref 6–20)
CO2: 22 mmol/L (ref 22–32)
Calcium: 9.2 mg/dL (ref 8.9–10.3)
Chloride: 103 mmol/L (ref 98–111)
Creatinine, Ser: 0.82 mg/dL (ref 0.61–1.24)
GFR, Estimated: >60 mL/min (ref >60)
Glucose, Bld: 111 mg/dL — ABNORMAL HIGH (ref 70–99)
Potassium: 3.6 mmol/L (ref 3.5–5.1)
Sodium: 136 mmol/L (ref 135–145)
Total Bilirubin: 1.4 mg/dL — ABNORMAL HIGH (ref 0.3–1.2)
Total Protein: 7.7 g/dL (ref 6.5–8.1)

## 2022-03-29 LAB — SEDIMENTATION RATE: Sed Rate: 140 mm/hr — ABNORMAL HIGH (ref 0–16)

## 2022-03-29 LAB — C-REACTIVE PROTEIN: CRP: 20.4 mg/dL — ABNORMAL HIGH (ref <1.0)

## 2022-03-29 LAB — URIC ACID: Uric Acid, Serum: 4.4 mg/dL (ref 3.7–8.6)

## 2022-03-29 LAB — HIV ANTIBODY (ROUTINE TESTING W REFLEX): HIV Screen 4th Generation wRfx: NONREACTIVE

## 2022-03-29 MED ORDER — PREDNISONE 20 MG PO TABS: 60.0000 mg | ORAL_TABLET | Freq: Every day | ORAL | 0 refills | Status: AC

## 2022-03-29 MED ORDER — MORPHINE SULFATE (PF) 4 MG/ML IV SOLN
4.0000 mg | Freq: Once | INTRAVENOUS | Status: AC
  Administered 2022-03-29: 4 mg via INTRAVENOUS
  Filled 2022-03-29: qty 1

## 2022-03-29 MED ORDER — METHYLPREDNISOLONE SODIUM SUCC 125 MG IJ SOLR
125.0000 mg | Freq: Once | INTRAMUSCULAR | Status: AC
  Administered 2022-03-29: 125 mg via INTRAVENOUS
  Filled 2022-03-29: qty 2

## 2022-03-29 NOTE — ED Provider Notes (Signed)
[facility] Provider Note   CSN: [id number] Arrival date & time: 03/29/22  1629     History  No chief complaint on file.   Darryl Reid is a 52 y.o. male here presenting with left hand and ankle swelling.  Patient presented to med Center on 1/13 for left hand and ankle swelling.  Patient had inflammatory markers that were drawn and ESR was 100.  CRP was elevated as well.  Patient was admitted to the hospital and was thought to have rheumatoid arthritis as his rheumatoid factor is elevated.  His ANA is negative.  Patient was sent home with a course of steroids which did not help with the pain or swelling.  Patient was referred to see rheumatology but did not see them yet. Patient went to urgent care yesterday and was noted to have hemoglobin of 11.8 down from 13.  Patient also was noted to have persistently elevated LFTs.  His hepatitis panel is negative and he denies any abdominal pain or fevers.  The history is provided by the patient. A language interpreter was used.       Home Medications Prior to Admission medications   Medication Sig Start Date End Date Taking? Authorizing Provider  acetaminophen (TYLENOL) 325 MG tablet Take 650 mg by mouth every 6 (six) hours as needed for mild pain.    [provider]  ibuprofen (ADVIL) 800 MG tablet Take 1 tablet (800 mg total) by mouth every 8 (eight) hours as needed. 03/13/22   Shelly Coss, MD  pantoprazole (PROTONIX) 40 MG tablet Take 1 tablet (40 mg total) by mouth daily for 14 days. 03/13/22 03/27/22  Shelly Coss, MD  predniSONE (STERAPRED UNI-PAK 48 TAB) 10 MG (48) TBPK tablet Take as directed. 03/28/22   Vanessa Kick, MD      Allergies    Patient has no known allergies.    Review of Systems   Review of Systems  Musculoskeletal:        Left hand and ankle pain.  All other systems reviewed and are negative.   Physical Exam Updated Vital Signs BP 132/89   Pulse  92   Temp 98 F (36.7 C)   Resp 16   SpO2 100%  Physical Exam Vitals and nursing note reviewed.  Constitutional:      Appearance: Normal appearance.  HENT:     Head: Normocephalic.     Nose: Nose normal.     Mouth/Throat:     Mouth: Mucous membranes are moist.  Eyes:     Extraocular Movements: Extraocular movements intact.     Pupils: Pupils are equal, round, and reactive to light.  Cardiovascular:     Rate and Rhythm: Normal rate and regular rhythm.     Pulses: Normal pulses.     Heart sounds: Normal heart sounds.  Pulmonary:     Effort: Pulmonary effort is normal.     Breath sounds: Normal breath sounds.  Abdominal:     General: Abdomen is flat.     Palpations: Abdomen is soft.  Musculoskeletal:     Cervical back: Normal range of motion and neck supple.     Comments: Left hand swollen and tender.  No particular joint tenderness or erythema.  Patient's left ankle is swollen and tender as well.  No concern for septic joint.  Skin:    General: Skin is warm.     Capillary Refill: Capillary refill takes less than 2 seconds.  Neurological:  General: No focal deficit present.     Mental Status: He is alert and oriented to person, place, and time.  Psychiatric:        Mood and Affect: Mood normal.        Behavior: Behavior normal.     ED Results / Procedures / Treatments   Labs (all labs ordered are listed, but only abnormal results are displayed) Labs Reviewed  COMPREHENSIVE METABOLIC PANEL - Abnormal; Notable for the following components:      Result Value   Glucose, Bld 111 (*)    Albumin 2.6 (*)    AST 71 (*)    ALT 100 (*)    Alkaline Phosphatase 171 (*)    Total Bilirubin 1.4 (*)    All other components within normal limits  CBC WITH DIFFERENTIAL/PLATELET - Abnormal; Notable for the following components:   RBC 3.76 (*)    Hemoglobin 11.6 (*)    HCT 34.6 (*)    Platelets 463 (*)    All other components within normal limits  SEDIMENTATION RATE   C-REACTIVE PROTEIN  URIC ACID  HIV ANTIBODY (ROUTINE TESTING W REFLEX)    EKG None  Radiology No results found.  Procedures Procedures    Medications Ordered in ED Medications  morphine (PF) 4 MG/ML injection 4 mg (4 mg Intravenous Given 03/29/22 2113)    ED Course/ Medical Decision Making/ A&P                             Medical Decision Making Darryl Reid is a 52 y.o. male here presenting with persistent left ankle and hand swelling and tenderness.  Patient has elevated inflammatory markers as well as rheumatoid factor.  I think likely underlying inflammatory process.  I also considered gout.  At this point we will repeat ESR and CRP and get uric acid level.  Will give pain medicine.  Patient may need lead prolonged steroid taper.  10:47 PM Patient's ESR is now up to 140.  It was 62 previously.  CRP remains elevated at 20.  I think he likely has inflammatory joint disease.  I ordered Solu-Medrol.  At this point, patient will likely need admission for another course of steroids.  11:21 PM I discussed with Dr. Alcario Drought regarding admission.  He states that there is no rheumatology available so he is not comfortable accepting the patient.  I was able to talk to the [facility] rheumatologist, Dr. Ephriam Jenkins.  He states that this is something he is comfortable managing outpatient and patient does not need to be transferred to [facility] for admission.  He states that he will see patient tomorrow.  Request that I rule out septic joint.  I did bedside ultrasound of both his left wrist and left ankle and there was no obvious effusion to perform arthrocentesis.  I do not think he has a septic joint.  I think he likely has worsening inflammatory arthritis.  Will prescribe prednisone and I told him to follow-up tomorrow with Dr. Posey Pronto.  Problems Addressed: Elevated rheumatoid factor: acute illness or injury ESR raised: acute illness or injury  Amount and/or Complexity of Data  Reviewed Labs: ordered. Decision-making details documented in ED Course. Radiology: ordered and independent interpretation performed. Decision-making details documented in ED Course.  Risk Prescription drug management. Decision regarding hospitalization.    Final Clinical Impression(s) / ED Diagnoses Final diagnoses:  None    Rx / DC Orders ED Discharge  Orders     None         Drenda Freeze, MD 03/29/22 [phone]

## 2022-03-29 NOTE — ED Provider Triage Note (Signed)
Emergency Medicine Provider Triage Evaluation Note  Darryl Reid , a 52 y.o. male  was evaluated in triage.  Pt complains of abnormal labs.  He was seen in urgent care recently for left wrist and bilateral ankle pain and swelling.  He had a recent admission.  In urgent care blood was drawn and he was found to have a decreased hemoglobin of 11.8 from 13.2.  This was the reason for his presentation.  Denies shortness of breath, dizziness, lightheadedness.  He states he does feel fatigued which has been present for a while but has been improving.  Review of Systems  Positive: As above Negative: As above  Physical Exam  BP 128/89 (BP Location: Right Arm)   Pulse 98   Temp 99.2 F (37.3 C) (Oral)   Resp 14   SpO2 98%  Gen:   Awake, no distress   Resp:  Normal effort  MSK:   Moves extremities without difficulty  Other:  Left hand with a moderate amount of swelling.  No tenderness to palpation.  Unable to make a fist.  Bilateral ankles with mild swelling, no tenderness to palpation  Medical Decision Making  Medically screening exam initiated at 4:48 PM.  Appropriate orders placed.  Darryl Reid was informed that the remainder of the evaluation will be completed by another provider, this initial triage assessment does not replace that evaluation, and the importance of remaining in the ED until their evaluation is complete.  Will repeat labs to reassess hemoglobin of 11.8   Roylene Reason, [place] 03/29/22 1650

## 2022-03-29 NOTE — ED Notes (Signed)
Patient verbalizes understanding of d/c instructions. Opportunities for questions and answers were provided. Pt d/c from ED and ambulated to lobby.

## 2022-03-29 NOTE — ED Notes (Signed)
Patient transported to X-ray 

## 2022-03-29 NOTE — ED Provider Notes (Signed)
[facility]   [id number] 03/28/22 Arrival Time: 1905  ASSESSMENT & PLAN:  1. Left wrist pain   2. Acute bilateral ankle pain    Unknown cause of current symptoms. Hg has dropped to 11.8 compared to 13.2 two weeks ago. Platelets elevated. LFTs still abnormal with new elevated alk phos. Will have RN call pt and inform of labs. Does not have a PCP. Social situation with limited resources.  Would likely be served best to return to ED.  Appears to have deteriorated further since recent hospital discharge.  Labs Reviewed  CBC WITH DIFFERENTIAL/PLATELET - Abnormal; Notable for the following components:      Result Value   RBC 3.75 (*)    Hemoglobin 11.8 (*)    HCT 34.1 (*)    Platelets 471 (*)    All other components within normal limits  COMPREHENSIVE METABOLIC PANEL - Abnormal; Notable for the following components:   Glucose, Bld 100 (*)    Albumin 2.6 (*)    AST 62 (*)    ALT 100 (*)    Alkaline Phosphatase 173 (*)    Total Bilirubin 2.2 (*)    All other components within normal limits  TSH    Discharge Medication List as of 03/28/2022  7:50 PM     START taking these medications   Details  predniSONE (STERAPRED UNI-PAK 48 TAB) 10 MG (48) TBPK tablet Take as directed., Normal        Orders Placed This Encounter  Procedures   CBC with Differential/Platelet   Comprehensive metabolic panel   TSH   Ambulatory referral to Rheumatology    Reviewed expectations re: course of current medical issues. Questions answered. Outlined signs and symptoms indicating need for more acute intervention. Patient verbalized understanding. After Visit Summary given.  SUBJECTIVE: History from: patient and wife . Darryl Reid is a 52 y.o. male recently discharged from hospital on 03/13/22; dx with unknown inflammatory disorder. Has finished steroids but reports significant swelling and continued pain of L wrist and continued bilateral ankle pain. Is now having to use  walker to assist with ambulation. No follow up since hospital discharge. No recent illnesses reports. Current symptoms have forced him to stop working. Overall feels very fatigued and weak. Normal appetite. Feels weight has been stable.  Past Surgical History:  Procedure Laterality Date   VASECTOMY        OBJECTIVE:  Vitals:   03/28/22 1931  BP: 122/83  Pulse: (!) 111  Resp: 18  Temp: 99.2 F (37.3 C)  TempSrc: Oral  SpO2: 96%    Tachycardia noted. Low grade temp noted.  General appearance: alert; no distress but appears fatigued and frail given his age; using walker to assist with very slow ambulation HEENT: ; AT Neck: supple with FROM Resp: unlabored respirations CV: regular; tachycardic Extremities: Swelling of L hand and wrist noted; is TTP; able to move fingers; reports bilateral ankle TTP; no significant edema CV: brisk extremity capillary refill of all extremities Skin: warm and dry; no visible rashes Psychological: alert and cooperative; normal mood and affect   No Known Allergies  History reviewed. No pertinent past medical history. Social History   Socioeconomic History   Marital status: Married    Spouse name: Not on file   Number of children: Not on file   Years of education: Not on file   Highest education level: Not on file  Occupational History   Not on file  Tobacco Use   Smoking  status: Never   Smokeless tobacco: Not on file  Substance and Sexual Activity   Alcohol use: Yes    Comment: occasional social   Drug use: No   Sexual activity: Not on file  Other Topics Concern   Not on file  Social History Narrative   Not on file   Social Determinants of Health   Financial Resource Strain: Not on file  Food Insecurity: No Food Insecurity (03/12/2022)   Hunger Vital Sign    Worried About Running Out of Food in the Last Year: Never true    Ran Out of Food in the Last Year: Never true  Transportation Needs: No Transportation Needs (03/12/2022)    PRAPARE - Hydrologist (Medical): No    Lack of Transportation (Non-Medical): No  Physical Activity: Not on file  Stress: Not on file  Social Connections: Not on file   Family History  Problem Relation Age of Onset   Diabetes Mellitus II Brother    Past Surgical History:  Procedure Laterality Date   Lady Deutscher, MD 03/29/22 1008

## 2022-03-29 NOTE — ED Triage Notes (Signed)
Pt presented to UC yesterday for pain and swelling to wrist and ankle. Was called back today for abnormal lab results and advised to come to the ED. Hg has dropped to 11.8 compared to 13.2 two weeks ago. Platelets elevated. LFTs still abnormal with new elevated alk phos.

## 2022-03-29 NOTE — Discharge Instructions (Addendum)
Take prednisone as prescribed.  Dr. Serita Grit office will call you tomorrow to schedule an appointment with him tomorrow  If his office does not call you, please call him tomorrow  This return to ER if you have worse wrist pain or ankle pain or fever

## 2022-03-30 ENCOUNTER — Telehealth: Payer: Self-pay

## 2022-03-30 DIAGNOSIS — Z79899 Other long term (current) drug therapy: Secondary | ICD-10-CM | POA: Diagnosis not present

## 2022-03-30 DIAGNOSIS — M0579 Rheumatoid arthritis with rheumatoid factor of multiple sites without organ or systems involvement: Secondary | ICD-10-CM | POA: Diagnosis not present

## 2022-03-30 NOTE — Patient Outreach (Signed)
Transition Care Management Unsuccessful Follow-up Telephone Call  Date of discharge and from where:  03/28/22 and 03/29/22  Attempts:  1st Attempt  Reason for unsuccessful TCM follow-up call:  Left voice message

## 2022-03-31 DIAGNOSIS — M069 Rheumatoid arthritis, unspecified: Secondary | ICD-10-CM | POA: Diagnosis not present

## 2022-03-31 DIAGNOSIS — J984 Other disorders of lung: Secondary | ICD-10-CM | POA: Diagnosis not present

## 2022-03-31 DIAGNOSIS — R911 Solitary pulmonary nodule: Secondary | ICD-10-CM | POA: Diagnosis not present

## 2022-04-27 NOTE — Progress Notes (Unsigned)
Office Visit Note  Patient: Darryl Reid Darryl Reid             Date of Birth: August 26, 1970           MRN: [id number]             PCP: Pcp, No Referring: Shelly Coss, MD Visit Date: 05/09/2022 Occupation: @GUAROCC @  Subjective:    History of Present Illness: Pacer [name] is a 52 y.o. male who presents today for a new patient consultation.    XR of both ankles performed on 03/29/22.    Activities of Daily Living:  Patient reports morning stiffness for 2 hours.   Patient Denies nocturnal pain.  Difficulty dressing/grooming: Denies Difficulty climbing stairs: Denies Difficulty getting out of chair: Denies Difficulty using hands for taps, buttons, cutlery, and/or writing: Reports  Review of Systems  Constitutional:  Positive for fatigue and night sweats.  HENT:  Positive for mouth dryness. Negative for mouth sores.   Eyes:  Positive for dryness.  Respiratory:  Positive for shortness of breath.   Cardiovascular:  Negative for chest pain and palpitations.  Gastrointestinal:  Negative for blood in stool, constipation and diarrhea.  Endocrine: Positive for increased urination.  Genitourinary:  Negative for involuntary urination.  Musculoskeletal:  Positive for joint pain, gait problem, joint pain, joint swelling, myalgias, muscle weakness, morning stiffness, muscle tenderness and myalgias.  Skin:  Positive for color change. Negative for rash, hair loss and sensitivity to sunlight.  Allergic/Immunologic: Negative for susceptible to infections.  Neurological:  Positive for night sweats. Negative for dizziness and headaches.  Hematological:  Positive for swollen glands.  Psychiatric/Behavioral:  Negative for depressed mood and sleep disturbance. The patient is not nervous/anxious.     PMFS History:  Patient Active Problem List   Diagnosis Date Noted   Abnormal LFTs 03/12/2022   Leukocytosis 03/12/2022   Polyarthralgia 03/11/2022   Vasovagal syncope 02/04/2015    [facility] [employer] examination 01/06/2015   Chronic low back pain 01/06/2015    No past medical history on file.  Family History  Problem Relation Age of Onset   Diabetes Mellitus II Brother    Past Surgical History:  Procedure Laterality Date   VASECTOMY     Social History   Social History Narrative   Not on file   Immunization History  Administered Date(s) Administered   Tdap 11/05/2014     Objective: Vital Signs: There were no vitals taken for this visit.   Physical Exam Vitals and nursing note reviewed.  Constitutional:      Appearance: He is well-developed.  HENT:     Head: Normocephalic and atraumatic.  Eyes:     Conjunctiva/sclera: Conjunctivae normal.     Pupils: Pupils are equal, round, and reactive to light.  Cardiovascular:     Rate and Rhythm: Normal rate and regular rhythm.     Heart sounds: Normal heart sounds.  Pulmonary:     Effort: Pulmonary effort is normal.     Breath sounds: Normal breath sounds.  Abdominal:     General: Bowel sounds are normal.     Palpations: Abdomen is soft.  Musculoskeletal:     Cervical back: Normal range of motion and neck supple.  Skin:    General: Skin is warm and dry.     Capillary Refill: Capillary refill takes less than 2 seconds.  Neurological:     Mental Status: He is alert and oriented to person, place, and time.  Psychiatric:  Behavior: Behavior normal.      Musculoskeletal Exam: ***  CDAI Exam: CDAI Score: -- Patient Global: --; Provider Global: -- Swollen: --; Tender: -- Joint Exam 05/09/2022   No joint exam has been documented for this visit   There is currently no information documented on the homunculus. Go to the Rheumatology activity and complete the homunculus joint exam.  Investigation: No additional findings.  Imaging: DG Hand Complete Left  Result Date: 03/29/2022 CLINICAL DATA:  Left hand pain and swelling EXAM: LEFT HAND - COMPLETE 3+ VIEW COMPARISON:  None Available. FINDINGS:  There is diffuse soft tissue swelling of the hand. There is no foreign body. There is no evidence of fracture or dislocation. There is no evidence of arthropathy or other focal bone abnormality. IMPRESSION: Diffuse soft tissue swelling of the hand. No acute osseous abnormality. No foreign body. Electronically Signed   By: Ronney Asters M.D.   On: 03/29/2022 21:55   DG Ankle Complete Left  Result Date: 03/29/2022 CLINICAL DATA:  Left ankle pain EXAM: LEFT ANKLE COMPLETE - 3+ VIEW COMPARISON:  None Available. FINDINGS: No acute fracture or dislocation. No widening of the ankle mortise. Mild soft tissue swelling about the ankle. Calcaneal spurs. IMPRESSION: No acute fracture or dislocation. Electronically Signed   By: Placido Sou M.D.   On: 03/29/2022 21:55    Recent Labs: Lab Results  Component Value Date   WBC 6.6 03/29/2022   HGB 11.6 (L) 03/29/2022   PLT 463 (H) 03/29/2022   NA 136 03/29/2022   K 3.6 03/29/2022   CL 103 03/29/2022   CO2 22 03/29/2022   GLUCOSE 111 (H) 03/29/2022   BUN 15 03/29/2022   CREATININE 0.82 03/29/2022   BILITOT 1.4 (H) 03/29/2022   ALKPHOS 171 (H) 03/29/2022   AST 71 (H) 03/29/2022   ALT 100 (H) 03/29/2022   PROT 7.7 03/29/2022   ALBUMIN 2.6 (L) 03/29/2022   CALCIUM 9.2 03/29/2022    Speciality Comments: No specialty comments available.  Procedures:  No procedures performed Allergies: Patient has no known allergies.   Assessment / Plan:     Visit Diagnoses: Acute bilateral ankle pain - 03/11/22: CRP 20.8, ESR 94. 03/12/21: RF 34.7, hep panel negative, ANA negative. 03/29/22: ESR 140, CRP 20.4, Uric acid 4.4  Polyarthralgia  Abnormal LFTs  Vasovagal syncope  Orders: No orders of the defined types were placed in this encounter.  No orders of the defined types were placed in this encounter.   Face-to-face time spent with patient was *** minutes. Greater than 50% of time was spent in counseling and coordination of care.  Follow-Up  Instructions: No follow-ups on file.   Ofilia Neas, PA-C  Note - This record has been created using Dragon software.  Chart creation errors have been sought, but may not always  have been located. Such creation errors do not reflect on  the standard of medical care.

## 2022-05-09 ENCOUNTER — Ambulatory Visit (INDEPENDENT_AMBULATORY_CARE_PROVIDER_SITE_OTHER): Payer: Medicaid Other | Admitting: Physician Assistant

## 2022-05-09 ENCOUNTER — Encounter: Payer: Self-pay | Admitting: Physician Assistant

## 2022-05-09 DIAGNOSIS — R7989 Other specified abnormal findings of blood chemistry: Secondary | ICD-10-CM

## 2022-05-09 DIAGNOSIS — M2550 Pain in unspecified joint: Secondary | ICD-10-CM

## 2022-05-09 DIAGNOSIS — M25572 Pain in left ankle and joints of left foot: Secondary | ICD-10-CM

## 2022-05-09 DIAGNOSIS — R55 Syncope and collapse: Secondary | ICD-10-CM

## 2022-05-10 NOTE — Progress Notes (Signed)
Patient is under the care of Dr. Winfield Rast at [facility].  Upcoming follow up visit scheduled 05/16/22.

## 2022-05-16 DIAGNOSIS — M19042 Primary osteoarthritis, left hand: Secondary | ICD-10-CM | POA: Diagnosis not present

## 2022-05-16 DIAGNOSIS — M0579 Rheumatoid arthritis with rheumatoid factor of multiple sites without organ or systems involvement: Secondary | ICD-10-CM | POA: Diagnosis not present

## 2022-05-16 DIAGNOSIS — Z79899 Other long term (current) drug therapy: Secondary | ICD-10-CM | POA: Diagnosis not present

## 2022-05-16 DIAGNOSIS — M19041 Primary osteoarthritis, right hand: Secondary | ICD-10-CM | POA: Diagnosis not present

## 2022-06-02 ENCOUNTER — Encounter (HOSPITAL_COMMUNITY): Payer: Self-pay | Admitting: Emergency Medicine

## 2022-06-02 ENCOUNTER — Encounter (HOSPITAL_COMMUNITY): Payer: Self-pay

## 2022-06-02 ENCOUNTER — Observation Stay (HOSPITAL_COMMUNITY)
Admission: EM | Admit: 2022-06-02 | Discharge: 2022-06-05 | Disposition: A | Discharge status: Home / Self Care | Payer: Medicaid Other | Attending: Obstetrics and Gynecology | Admitting: Obstetrics and Gynecology

## 2022-06-02 ENCOUNTER — Ambulatory Visit (HOSPITAL_COMMUNITY)
Admission: EM | Admit: 2022-06-02 | Discharge: 2022-06-02 | Disposition: A | Discharge status: Home / Self Care | Payer: Medicaid Other

## 2022-06-02 ENCOUNTER — Other Ambulatory Visit: Payer: Self-pay

## 2022-06-02 DIAGNOSIS — Z79899 Other long term (current) drug therapy: Secondary | ICD-10-CM | POA: Diagnosis not present

## 2022-06-02 DIAGNOSIS — R739 Hyperglycemia, unspecified: Secondary | ICD-10-CM | POA: Diagnosis not present

## 2022-06-02 DIAGNOSIS — M0579 Rheumatoid arthritis with rheumatoid factor of multiple sites without organ or systems involvement: Secondary | ICD-10-CM | POA: Diagnosis not present

## 2022-06-02 DIAGNOSIS — E1110 Type 2 diabetes mellitus with ketoacidosis without coma: Secondary | ICD-10-CM | POA: Diagnosis not present

## 2022-06-02 DIAGNOSIS — M069 Rheumatoid arthritis, unspecified: Secondary | ICD-10-CM | POA: Diagnosis present

## 2022-06-02 DIAGNOSIS — E1165 Type 2 diabetes mellitus with hyperglycemia: Secondary | ICD-10-CM | POA: Diagnosis not present

## 2022-06-02 DIAGNOSIS — N179 Acute kidney failure, unspecified: Secondary | ICD-10-CM | POA: Insufficient documentation

## 2022-06-02 DIAGNOSIS — R531 Weakness: Secondary | ICD-10-CM | POA: Diagnosis not present

## 2022-06-02 DIAGNOSIS — E860 Dehydration: Secondary | ICD-10-CM | POA: Diagnosis not present

## 2022-06-02 HISTORY — DX: Rheumatoid arthritis, unspecified: M06.9

## 2022-06-02 LAB — I-STAT VENOUS BLOOD GAS, ED
Acid-base deficit: 1 mmol/L (ref 0.0–2.0)
Bicarbonate: 23.4 mmol/L (ref 20.0–28.0)
Calcium, Ion: 1.16 mmol/L (ref 1.15–1.40)
HCT: 42 % (ref 39.0–52.0)
Hemoglobin: 14.3 g/dL (ref 13.0–17.0)
O2 Saturation: 99 %
Potassium: 3.9 mmol/L (ref 3.5–5.1)
Sodium: 129 mmol/L — ABNORMAL LOW (ref 135–145)
TCO2: 24 mmol/L (ref 22–32)
pCO2, Ven: 37 mmHg — ABNORMAL LOW (ref 44–60)
pH, Ven: 7.408 (ref 7.25–7.43)
pO2, Ven: 162 mmHg — ABNORMAL HIGH (ref 32–45)

## 2022-06-02 LAB — URINALYSIS, ROUTINE W REFLEX MICROSCOPIC
Bacteria, UA: NONE SEEN
Bilirubin Urine: NEGATIVE
Glucose, UA: >=500 mg/dL — AB
Hgb urine dipstick: NEGATIVE
Ketones, ur: 20 mg/dL — AB
Leukocytes,Ua: NEGATIVE
Nitrite: NEGATIVE
Protein, ur: NEGATIVE mg/dL
Specific Gravity, Urine: 1.022 (ref 1.005–1.030)
pH: 5 (ref 5.0–8.0)

## 2022-06-02 LAB — BASIC METABOLIC PANEL
Anion gap: 16 — ABNORMAL HIGH (ref 5–15)
BUN: 14 mg/dL (ref 6–20)
CO2: 21 mmol/L — ABNORMAL LOW (ref 22–32)
Calcium: 9 mg/dL (ref 8.9–10.3)
Chloride: 91 mmol/L — ABNORMAL LOW (ref 98–111)
Creatinine, Ser: 1.27 mg/dL — ABNORMAL HIGH (ref 0.61–1.24)
GFR, Estimated: >60 mL/min (ref >60)
Glucose, Bld: 620 mg/dL (ref 70–99)
Potassium: 4.6 mmol/L (ref 3.5–5.1)
Sodium: 128 mmol/L — ABNORMAL LOW (ref 135–145)

## 2022-06-02 LAB — GLUCOSE, CAPILLARY
Glucose-Capillary: 278 mg/dL — ABNORMAL HIGH (ref 70–99)
Glucose-Capillary: 71 mg/dL (ref 70–99)

## 2022-06-02 LAB — CBG MONITORING, ED
Glucose-Capillary: >600 mg/dL (ref 70–99)
Glucose-Capillary: 453 mg/dL — ABNORMAL HIGH (ref 70–99)
Glucose-Capillary: 410 mg/dL — ABNORMAL HIGH (ref 70–99)
Glucose-Capillary: 354 mg/dL — ABNORMAL HIGH (ref 70–99)
Glucose-Capillary: 262 mg/dL — ABNORMAL HIGH (ref 70–99)
Glucose-Capillary: 83 mg/dL (ref 70–99)
Glucose-Capillary: 99 mg/dL (ref 70–99)

## 2022-06-02 LAB — CBC
HCT: 39.8 % (ref 39.0–52.0)
Hemoglobin: 14.7 g/dL (ref 13.0–17.0)
MCH: 32 pg (ref 26.0–34.0)
MCHC: 36.9 g/dL — ABNORMAL HIGH (ref 30.0–36.0)
MCV: 86.7 fL (ref 80.0–100.0)
Platelets: 340 10*3/uL (ref 150–400)
RBC: 4.59 MIL/uL (ref 4.22–5.81)
RDW: 14.1 % (ref 11.5–15.5)
WBC: 5 10*3/uL (ref 4.0–10.5)
nRBC: 0 % (ref 0.0–0.2)

## 2022-06-02 LAB — BETA-HYDROXYBUTYRIC ACID: Beta-Hydroxybutyric Acid: 4.42 mmol/L — ABNORMAL HIGH (ref 0.05–0.27)

## 2022-06-02 MED ORDER — ACETAMINOPHEN 325 MG PO TABS
650.0000 mg | ORAL_TABLET | Freq: Four times a day (QID) | ORAL | Status: DC | PRN
  Administered 2022-06-03: 650 mg via ORAL
  Filled 2022-06-02: qty 2

## 2022-06-02 MED ORDER — ONDANSETRON HCL 4 MG/2ML IJ SOLN: 4.0000 mg | Freq: Four times a day (QID) | INTRAMUSCULAR | Status: DC | PRN

## 2022-06-02 MED ORDER — ACETAMINOPHEN 650 MG RE SUPP: 650.0000 mg | Freq: Four times a day (QID) | RECTAL | Status: DC | PRN

## 2022-06-02 MED ORDER — MELATONIN 3 MG PO TABS: 3.0000 mg | ORAL_TABLET | Freq: Every evening | ORAL | Status: DC | PRN

## 2022-06-02 MED ORDER — KCL IN DEXTROSE-NACL 10-5-0.45 MEQ/L-%-% IV SOLN
INTRAVENOUS | Status: DC
  Filled 2022-06-02: qty 1000

## 2022-06-02 MED ORDER — DEXTROSE 50 % IV SOLN: 0.0000 mL | INTRAVENOUS | Status: DC | PRN

## 2022-06-02 MED ORDER — POTASSIUM CHLORIDE 10 MEQ/100ML IV SOLN
10.0000 meq | INTRAVENOUS | Status: AC
  Administered 2022-06-02: 10 meq via INTRAVENOUS
  Filled 2022-06-02: qty 100

## 2022-06-02 MED ORDER — LACTATED RINGERS IV BOLUS
20.0000 mL/kg | Freq: Once | INTRAVENOUS | Status: AC
  Administered 2022-06-02: 1316 mL via INTRAVENOUS

## 2022-06-02 MED ORDER — PREDNISONE 10 MG PO TABS
10.0000 mg | ORAL_TABLET | Freq: Every day | ORAL | Status: DC
  Administered 2022-06-03 – 2022-06-05 (×3): 10 mg via ORAL
  Filled 2022-06-02 (×3): qty 1

## 2022-06-02 MED ORDER — LACTATED RINGERS IV SOLN: INTRAVENOUS | Status: DC

## 2022-06-02 MED ORDER — DEXTROSE IN LACTATED RINGERS 5 % IV SOLN: INTRAVENOUS | Status: DC

## 2022-06-02 MED ORDER — INSULIN REGULAR(HUMAN) IN NACL 100-0.9 UT/100ML-% IV SOLN
INTRAVENOUS | Status: DC
  Administered 2022-06-02: 6 [IU]/h via INTRAVENOUS
  Filled 2022-06-02: qty 100

## 2022-06-02 NOTE — ED Notes (Signed)
Patient is being discharged from the Urgent Care and sent to the Emergency Department via POV . Per [place], [place] patient is in need of higher level of care due to hyperglycemia. Patient is aware and verbalizes understanding of plan of care.  Vitals:   06/02/22 1308  BP: 105/73  Pulse: 80  Resp: 16  Temp: 98.9 F (37.2 C)  SpO2: 97%

## 2022-06-02 NOTE — ED Notes (Signed)
ED TO INPATIENT HANDOFF REPORT  ED Nurse Name and Phone #: Jodie Echevaria [phone]  S Name/Age/Gender Rella Larve Waso Bi Kopko 52 y.o. male Room/Bed: 025C/025C  Code Status   Code Status: Full Code  Home/SNF/Other Home Patient oriented to: self, place, time, and situation Is this baseline? Yes   Triage Complete: Triage complete  Chief Complaint DKA (diabetic ketoacidosis) [E11.10]  Triage Note Patient arrives ambulatory by POV states he was sent here for hyperglycemia. C/o feeling drowsy and weak.    Allergies No Known Allergies  Level of Care/Admitting Diagnosis ED Disposition     ED Disposition  Admit   Condition  --   Comment  Hospital Area: [facility] [[id number]]  Level of Care: Progressive [102]  Admit to Progressive based on following criteria: MULTISYSTEM THREATS such as stable sepsis, metabolic/electrolyte imbalance with or without encephalopathy that is responding to early treatment.  Admit to Progressive based on following criteria: GI, ENDOCRINE disease patients with GI bleeding, acute liver failure or pancreatitis, stable with diabetic ketoacidosis or thyrotoxicosis (hypothyroid) state.  May place patient in observation at [facility] or [facility] if equivalent level of care is available:: No  Covid Evaluation: Asymptomatic - no recent exposure (last 10 days) testing not required  Diagnosis: DKA (diabetic ketoacidosis) [588502]  Admitting Physician: Angie Fava [[id number]]  Attending Physician: Angie Fava [[id number]]          B Medical/Surgery History Past Medical History:  Diagnosis Date   Polyarthralgia    Past Surgical History:  Procedure Laterality Date   VASECTOMY       A IV Location/Drains/Wounds Patient Lines/Drains/Airways Status     Active Line/Drains/Airways     Name Placement date Placement time Site Days   Peripheral IV 06/02/22 20 G Left;Posterior Forearm 06/02/22  1719  Forearm  less than 1             Intake/Output Last 24 hours  Intake/Output Summary (Last 24 hours) at 06/02/2022 2128 Last data filed at 06/02/2022 2046 Gross per 24 hour  Intake 1693.32 ml  Output --  Net 1693.32 ml    Labs/Imaging Results for orders placed or performed during the hospital encounter of 06/02/22 (from the past 48 hour(s))  CBG monitoring, ED     Status: Abnormal   Collection Time: 06/02/22  1:40 PM  Result Value Ref Range   Glucose-Capillary >600 (HH) 70 - 99 mg/dL    Comment: Glucose reference range applies only to samples taken after fasting for at least 8 hours.  Basic metabolic panel     Status: Abnormal   Collection Time: 06/02/22  1:42 PM  Result Value Ref Range   Sodium 128 (L) 135 - 145 mmol/L   Potassium 4.6 3.5 - 5.1 mmol/L   Chloride 91 (L) 98 - 111 mmol/L   CO2 21 (L) 22 - 32 mmol/L   Glucose, Bld 620 (HH) 70 - 99 mg/dL    Comment: CRITICAL RESULT CALLED TO, READ BACK BY AND VERIFIED WITH K RAND RN AT [phone] BY D LONG Glucose reference range applies only to samples taken after fasting for at least 8 hours.    BUN 14 6 - 20 mg/dL   Creatinine, Ser 6.29 (H) 0.61 - 1.24 mg/dL   Calcium 9.0 8.9 - 47.6 mg/dL   GFR, Estimated >54 >65 mL/min    Comment: (NOTE) Calculated using the CKD-EPI Creatinine Equation (2021)    Anion gap 16 (H) 5 - 15    Comment:  Performed at [facility], [address] [place], [place] [address]  CBC     Status: Abnormal   Collection Time: 06/02/22  1:42 PM  Result Value Ref Range   WBC 5.0 4.0 - 10.5 K/uL   RBC 4.59 4.22 - 5.81 MIL/uL   Hemoglobin 14.7 13.0 - 17.0 g/dL   HCT 74.1 63.8 - 45.3 %   MCV 86.7 80.0 - 100.0 fL   MCH 32.0 26.0 - 34.0 pg   MCHC 36.9 (H) 30.0 - 36.0 g/dL   RDW 64.6 80.3 - 21.2 %   Platelets 340 150 - 400 K/uL   nRBC 0.0 0.0 - 0.2 %    Comment: Performed at [facility], [address] [place], [place] [address]  CBG monitoring, ED     Status: Abnormal   Collection Time: 06/02/22  5:09 PM  Result Value  Ref Range   Glucose-Capillary 453 (H) 70 - 99 mg/dL    Comment: Glucose reference range applies only to samples taken after fasting for at least 8 hours.  CBG monitoring, ED     Status: Abnormal   Collection Time: 06/02/22  5:57 PM  Result Value Ref Range   Glucose-Capillary 410 (H) 70 - 99 mg/dL    Comment: Glucose reference range applies only to samples taken after fasting for at least 8 hours.  I-Stat Venous Blood Gas, ED     Status: Abnormal   Collection Time: 06/02/22  6:03 PM  Result Value Ref Range   pH, Ven 7.408 7.25 - 7.43   pCO2, Ven 37.0 (L) 44 - 60 mmHg   pO2, Ven 162 (H) 32 - 45 mmHg   Bicarbonate 23.4 20.0 - 28.0 mmol/L   TCO2 24 22 - 32 mmol/L   O2 Saturation 99 %   Acid-base deficit 1.0 0.0 - 2.0 mmol/L   Sodium 129 (L) 135 - 145 mmol/L   Potassium 3.9 3.5 - 5.1 mmol/L   Calcium, Ion 1.16 1.15 - 1.40 mmol/L   HCT 42.0 39.0 - 52.0 %   Hemoglobin 14.3 13.0 - 17.0 g/dL   Sample type VENOUS   Beta-hydroxybutyric acid     Status: Abnormal   Collection Time: 06/02/22  6:53 PM  Result Value Ref Range   Beta-Hydroxybutyric Acid 4.42 (H) 0.05 - 0.27 mmol/L    Comment: Performed at [facility], [address], [place], [place] [address]  CBG monitoring, ED     Status: Abnormal   Collection Time: 06/02/22  6:53 PM  Result Value Ref Range   Glucose-Capillary 354 (H) 70 - 99 mg/dL    Comment: Glucose reference range applies only to samples taken after fasting for at least 8 hours.  CBG monitoring, ED     Status: Abnormal   Collection Time: 06/02/22  8:00 PM  Result Value Ref Range   Glucose-Capillary 262 (H) 70 - 99 mg/dL    Comment: Glucose reference range applies only to samples taken after fasting for at least 8 hours.   No results found.  Pending Labs Unresulted Labs (From admission, onward)     Start     Ordered   06/03/22 0900  Basic metabolic panel  Once-Timed,   TIMED        06/02/22 2031   06/03/22 0500  CBC with Differential/Platelet  Tomorrow  morning,   R        06/02/22 2030   06/03/22 0500  Comprehensive metabolic panel  Tomorrow morning,  R        06/02/22 2030   06/03/22 0500  Magnesium  Tomorrow morning,   R        06/02/22 2030   06/03/22 0001  Basic metabolic panel  Once-Timed,   TIMED        06/02/22 2031   06/02/22 2035  Hemoglobin A1c  Add-on,   AD        06/02/22 2034   06/02/22 2034  Anti-islet cell antibody  Add-on,   AD        06/02/22 2033   06/02/22 2034  C-peptide  Add-on,   AD        06/02/22 2033   06/02/22 2031  Magnesium  Add-on,   AD        06/02/22 2030   06/02/22 1546  Beta-hydroxybutyric acid  (Diabetes Ketoacidosis (DKA))  Now then every 8 hours,   STAT      06/02/22 1625   06/02/22 1545  [facility]  Once,   R        06/02/22 1625   06/02/22 1342  Urinalysis, Routine w reflex microscopic -Urine, Clean Catch  Once,   URGENT       Question:  Specimen Source  Answer:  Urine, Clean Catch   06/02/22 1341            Vitals/Pain Today's Vitals   06/02/22 1707 06/02/22 1830 06/02/22 1930 06/02/22 2030  BP: 118/84 128/84 (!) 126/90 121/87  Pulse: 77 (!) 59 65 74  Resp: 18 14 13 14   Temp: 97.9 F (36.6 C)   97.9 F (36.6 C)  TempSrc: Oral   Oral  SpO2: 99% 98% 98% 98%  Weight:      Height:      PainSc:    0-No pain    Isolation Precautions No active isolations  Medications Medications  insulin regular, human (MYXREDLIN) 100 units/ 100 mL infusion (6 Units/hr Intravenous New Bag/Given 06/02/22 1815)  dextrose 50 % solution 0-50 mL (has no administration in time range)  potassium chloride 10 mEq in 100 mL IVPB (0 mEq Intravenous Stopped 06/02/22 1832)  acetaminophen (TYLENOL) tablet 650 mg (has no administration in time range)    Or  acetaminophen (TYLENOL) suppository 650 mg (has no administration in time range)  melatonin tablet 3 mg (has no administration in time range)  ondansetron (ZOFRAN) injection 4 mg (has no administration in time range)  dextrose 5 % and  0.45 % NaCl with KCl 10 mEq/L infusion (has no administration in time range)  lactated ringers bolus 1,316 mL (0 mLs Intravenous Stopped 06/02/22 1853)    Mobility walks     Focused Assessments Cardiac Assessment Handoff:    No results found for: "CKTOTAL", "CKMB", "CKMBINDEX", "TROPONINI" No results found for: "DDIMER" Does the Patient currently have chest pain? No   , Neuro Assessment Handoff:  Swallow screen pass? Yes          Neuro Assessment:   Neuro Checks:      Has TPA been given? No If patient is a Neuro Trauma and patient is going to OR before floor call report to 4N Charge nurse: [phone] or [phone]  , Pulmonary Assessment Handoff:  Lung sounds:   O2 Device: Room Air      R Recommendations: See Admitting Provider Note  Report given to:   Additional Notes: n/a

## 2022-06-02 NOTE — ED Provider Notes (Signed)
[place]   CSN: [id number] Arrival date & time: 06/02/22  1331     History  Chief Complaint  Patient presents with   Hyperglycemia    Darryl Reid Doylene CanningBwato is a 52 y.o. male with past medical history significant for rheumatoid arthritis who has been taking steroids chronically since January presents with concern for hyperglycemia.  Patient reports he has been feeling drowsy, weak this morning.  He does report that he has been having increased urination, thirst for several weeks.  He reports that he has been drinking more sugary drinks, endorses drinking Fanta and eating a lot of pineapple.  He denies any previous history of diabetes.  He denies any vision changes.  He reports that he had been on a steroid taper, had been taking 3 tablets, then 2 tablets, is now taking 1 tablet, he cannot tell me the dosage but thinks that it may be 10 mg.   Hyperglycemia Associated symptoms: increased thirst, polyuria and weakness        Home Medications Prior to Admission medications   Medication Sig Start Date End Date Taking? Authorizing Provider  acetaminophen (TYLENOL) 325 MG tablet Take 650 mg by mouth every 6 (six) hours as needed for mild pain.    [provider]  ibuprofen (ADVIL) 800 MG tablet Take 1 tablet (800 mg total) by mouth every 8 (eight) hours as needed. 03/13/22   Burnadette PopAdhikari, Amrit, MD  pantoprazole (PROTONIX) 40 MG tablet Take 1 tablet (40 mg total) by mouth daily for 14 days. 03/13/22 03/27/22  Burnadette PopAdhikari, Amrit, MD      Allergies    Patient has no known allergies.    Review of Systems   Review of Systems  Endocrine: Positive for polydipsia and polyuria.  Neurological:  Positive for weakness.  All other systems reviewed and are negative.   Physical Exam Updated Vital Signs BP 128/84   Pulse (!) 59   Temp 97.9 F (36.6 C) (Oral)   Resp 14   Ht 5' 4.75" (1.645 m)   Wt 65.8 kg   SpO2 98%   BMI 24.32  kg/m  Physical Exam Vitals and nursing note reviewed.  Constitutional:      General: He is not in acute distress.    Appearance: Normal appearance.  HENT:     Head: Normocephalic and atraumatic.  Eyes:     General:        Right eye: No discharge.        Left eye: No discharge.  Cardiovascular:     Rate and Rhythm: Normal rate and regular rhythm.     Heart sounds: No murmur heard.    No friction rub. No gallop.  Pulmonary:     Effort: Pulmonary effort is normal.     Breath sounds: Normal breath sounds.  Abdominal:     General: Bowel sounds are normal.     Palpations: Abdomen is soft.  Skin:    General: Skin is warm and dry.     Capillary Refill: Capillary refill takes less than 2 seconds.  Neurological:     Mental Status: He is alert and oriented to person, place, and time.  Psychiatric:        Mood and Affect: Mood normal.        Behavior: Behavior normal.     ED Results / Procedures / Treatments   Labs (all labs ordered are listed, but only abnormal results are displayed) Labs Reviewed  BASIC METABOLIC PANEL - Abnormal; Notable for the following components:      Result Value   Sodium 128 (*)    Chloride 91 (*)    CO2 21 (*)    Glucose, Bld 620 (*)    Creatinine, Ser 1.27 (*)    Anion gap 16 (*)    All other components within normal limits  CBC - Abnormal; Notable for the following components:   MCHC 36.9 (*)    All other components within normal limits  BETA-HYDROXYBUTYRIC ACID - Abnormal; Notable for the following components:   Beta-Hydroxybutyric Acid 4.42 (*)    All other components within normal limits  CBG MONITORING, ED - Abnormal; Notable for the following components:   Glucose-Capillary >600 (*)    All other components within normal limits  CBG MONITORING, ED - Abnormal; Notable for the following components:   Glucose-Capillary 453 (*)    All other components within normal limits  I-STAT VENOUS BLOOD GAS, ED - Abnormal; Notable for the following  components:   pCO2, Ven 37.0 (*)    pO2, Ven 162 (*)    Sodium 129 (*)    All other components within normal limits  CBG MONITORING, ED - Abnormal; Notable for the following components:   Glucose-Capillary 410 (*)    All other components within normal limits  CBG MONITORING, ED - Abnormal; Notable for the following components:   Glucose-Capillary 354 (*)    All other components within normal limits  CBG MONITORING, ED - Abnormal; Notable for the following components:   Glucose-Capillary 262 (*)    All other components within normal limits  URINALYSIS, ROUTINE W REFLEX MICROSCOPIC  BLOOD GAS, VENOUS  BETA-HYDROXYBUTYRIC ACID  BETA-HYDROXYBUTYRIC ACID  CBC WITH DIFFERENTIAL/PLATELET  COMPREHENSIVE METABOLIC PANEL  MAGNESIUM  MAGNESIUM  BASIC METABOLIC PANEL    EKG None  Radiology No results found.  Procedures .Critical Care  Performed by: Olene Floss, PA-C Authorized by: Olene Floss, PA-C   Critical care provider statement:    Critical care time (minutes):  35   Critical care was necessary to treat or prevent imminent or life-threatening deterioration of the following conditions:  Endocrine crisis (DKA)   Critical care was time spent personally by me on the following activities:  Development of treatment plan with patient or surrogate, discussions with consultants, evaluation of patient's response to treatment, examination of patient, ordering and review of laboratory studies, ordering and review of radiographic studies, ordering and performing treatments and interventions, pulse oximetry, re-evaluation of patient's condition and review of old charts     Medications Ordered in ED Medications  insulin regular, human (MYXREDLIN) 100 units/ 100 mL infusion (6 Units/hr Intravenous New Bag/Given 06/02/22 1815)  lactated ringers infusion ( Intravenous New Bag/Given 06/02/22 1832)  dextrose 5 % in lactated ringers infusion (has no administration in time range)   dextrose 50 % solution 0-50 mL (has no administration in time range)  potassium chloride 10 mEq in 100 mL IVPB (0 mEq Intravenous Stopped 06/02/22 1832)  acetaminophen (TYLENOL) tablet 650 mg (has no administration in time range)    Or  acetaminophen (TYLENOL) suppository 650 mg (has no administration in time range)  melatonin tablet 3 mg (has no administration in time range)  ondansetron (ZOFRAN) injection 4 mg (has no administration in time range)  lactated ringers bolus 1,316 mL (0 mLs Intravenous Stopped 06/02/22 1853)    ED Course/ Medical Decision Making/ A&P  Medical Decision Making Amount and/or Complexity of Data Reviewed Labs: ordered.  Risk Prescription drug management.   This patient is a 52 y.o. male  who presents to the ED for concern of fatigue, polyuria, polydipsia, hyperglycemia at urgent care.   Differential diagnoses prior to evaluation: The emergent differential diagnosis includes, but is not limited to, new diabetes diagnosis, type I, type II, versus steroid-induced hyperglycemia, developing HHS, DKA, versus other causes of generalized fatigue unrelated to noted hyperglycemia including but not limited to CVA, spinal cord injury, ACS, arrhythmia, syncope, orthostatic hypotension, sepsis, hypoglycemia, hypoxia, electrolyte disturbance, endocrine disorder, anemia, environmental exposure, polypharmacy. This is not an exhaustive differential.   Past Medical History / Co-morbidities: Past medical history significant for rheumatoid arthritis, currently taking prednisone, patient reports that he is on a prolonged taper, but he cannot tell me exact amounts,  Additional history: Chart reviewed. Pertinent results include:  I did review outpatient rheumatology visit notes, but at least on initial evaluation of notes from outside [facility] clinic I am having difficulty seeing exactly what dose of prednisone patient is taking  Physical Exam: Physical exam  performed. The pertinent findings include: Patient is overall well-appearing with stable vital signs on my exam.  Does seem somewhat fatigued subjectively.  Lab Tests/Imaging studies: I personally interpreted labs/imaging and the pertinent results include: Initial lab work with significant hyperglycemia, glucose 620, he has a pseudohyponatremia, sodium 128, True Value between 136-140.  He does have an elevated beta hydroxybutyrate of 4.42, thankfully without significant acidosis on VBG, his CBC is overall unremarkable.  Between his anion gap and BHB I do have high suspicion that patient was in developing DKA on arrival.  Thankfully arrived to the emergency department promptly and without altered mental status, Kussmaul respiration on my evaluation.   Medications: I ordered medication including fluid bolus, Endo tool insulin, on reevaluation patient has had significant improvement of his CBG, most recent at time of recheck was 262 she also received lactated Ringer's fluid bolus..  I have reviewed the patients home medicines and have made adjustments as needed.   Consults: I requested consultation with the hospitalist, spoke with Newton Pigg, MD, and discussed relevant lab work, imaging, after discussion hospitalist agreed that the appropriate disposition for this patient would be admission for evaluation of new onset hyperglycemia, possible diabetes, discussion of outpatient hyperglycemia management  Disposition: After consideration of the diagnostic results and the patients response to treatment, I feel that patient would benefit from admission as discussed above.   emergency department workup does not suggest an emergent condition requiring admission or immediate intervention beyond what has been performed at this time. The plan is: as above. The patient is safe for discharge and has been instructed to return immediately for worsening symptoms, change in symptoms or any other concerns.  Final  Clinical Impression(s) / ED Diagnoses Final diagnoses:  Hyperglycemia    Rx / DC Orders ED Discharge Orders     None         [place] 06/02/22 2033    Linwood Dibbles, MD 06/02/22 2050

## 2022-06-02 NOTE — ED Notes (Signed)
Pt report received from previous nurse. Pt A&O x4, vitals stable, denies needs/complaints. Call bell in reach. No acute distress noted.  

## 2022-06-02 NOTE — ED Triage Notes (Signed)
Pt states he had blood drawn this am and he received a call from the clinic that is BS was high and that he should go to UC or the ED.

## 2022-06-02 NOTE — ED Provider Notes (Signed)
Patient has been redirected to the emergency room for rule out diabetic ketoacidosis given blood sugar level greater than 600.   Darryl Reid, [place] 06/02/22 1315

## 2022-06-02 NOTE — ED Triage Notes (Signed)
Patient arrives ambulatory by POV states he was sent here for hyperglycemia. C/o feeling drowsy and weak.

## 2022-06-02 NOTE — H&P (Signed)
History and Physical      Darryl Reid DJM:[id number] DOB: Apr 02, 1970 DOA: 06/02/2022  PCP: Pcp, No (will further assess) Patient coming from: home   I have personally briefly reviewed patient's old medical records in [facility]  Chief Complaint: Generalized weakness  HPI: Darryl Reid is a 52 y.o. male with medical history significant for rheumatoid arthritis who is admitted to [facility] on 06/02/2022 with diabetic ketoacidosis after presenting from home to [facility] ED complaining of generalized weakness.   The patient reports 1 week of generalized weakness in the absence of any acute focal weakness.  He also notes worsening polydipsia/polyuria over the last 2 to 3 weeks, which is new for him.  Denies any associated recent subjective fever, chills, rigors, or generalized myalgias.  No recent neck stiffness, rash, abdominal pain, nausea, vomiting, diarrhea, dysuria, increased hematuria, shortness of breath, cough.  He also denies any recent chest pain, palpitations, diaphoresis, dizziness, presyncope, or syncope.  Denies any known history of underlying diabetes.  However, he conveys a history of rheumatoid arthritis, for which he has been on a long 78-month steroid taper via prednisone, noting that he is now down to 10 mg p.o. daily of prednisone.     ED Course:  Vital signs in the ED were notable for the following: Afebrile; initial heart rates in the 90s, socially decreasing into the 60s to 70s following interval IV fluids, as further quantified below; systolic blood pressures in the low 100s to 120s; respiratory rate 14-18, oxygen saturation 98-9 9% on room air.  Labs were notable for the following: Initial CBG greater than 600; VBG 7.4 08/37/162/20 3.4; beta-hydroxybutyrate acid 4.42.  BMP notable for the following: Sodium 128, which corrects to approximately 136 when taking into account concomitant hyperglycemia, potassium 4.6, chloride 91, bicarbonate 21, anion  gap 16, creatinine 1.27 compared to most recent prior serum creatinine data point of 0.82 on 03/29/2022, glucose 620.  CBC notable for white blood cell count 5000.  Urinalysis notable for no white blood cells, leukocyte esterase/nitrate negative, greater than 500 glucose, 20 ketones, specific gravity 1.022.  While in the ED, the following were administered: Was started on insulin drip via Endo tool, lactated Ringer's x 1316 cc bolus, followed by continuous lactated Ringer's at 125 cc/h.  Subsequently, the patient was admitted  for further evaluation management of presenting DKA in the setting of apparent new diagnosis of diabetes, with presentation also notable for generalized weakness, acute kidney injury, and dehydration.     Review of Systems: As per HPI otherwise 10 point review of systems negative.   Past Medical History:  Diagnosis Date   Polyarthralgia    RA (rheumatoid arthritis)     Past Surgical History:  Procedure Laterality Date   VASECTOMY      Social History:  reports that he has never smoked. He has never been exposed to tobacco smoke. He has never used smokeless tobacco. He reports current alcohol use. He reports that he does not use drugs.   No Known Allergies  Family History  Problem Relation Age of Onset   Hypertension Mother      Prior to Admission medications   Medication Sig Start Date End Date Taking? Authorizing Provider  acetaminophen (TYLENOL) 325 MG tablet Take 650 mg by mouth every 6 (six) hours as needed for mild pain.    [provider]  ibuprofen (ADVIL) 800 MG tablet Take 1 tablet (800 mg total) by mouth every 8 (eight)  hours as needed. 03/13/22   Burnadette Pop, MD  pantoprazole (PROTONIX) 40 MG tablet Take 1 tablet (40 mg total) by mouth daily for 14 days. 03/13/22 03/27/22  Burnadette Pop, MD     Objective    Physical Exam: Vitals:   06/02/22 1707 06/02/22 1830 06/02/22 1930 06/02/22 2030  BP: 118/84 128/84 (!) 126/90 121/87   Pulse: 77 (!) 59 65 74  Resp: Temp: 97.9 F (36.6 C)   97.9 F (36.6 C)  TempSrc: Oral   Oral  SpO2: 99% 98% 98% 98%  Weight:      Height:        General: appears to be stated age; alert, oriented Skin: warm, dry, no rash Head:  AT/ Mouth:  Oral mucosa membranes appear dry, normal dentition Neck: supple; trachea midline Heart:  RRR; did not appreciate any M/R/G Lungs: CTAB, did not appreciate any wheezes, rales, or rhonchi Abdomen: + BS; soft, ND, NT Vascular: 2+ pedal pulses b/l; 2+ radial pulses b/l Extremities: no peripheral edema, no muscle wasting Neuro: strength and sensation intact in upper and lower extremities b/l    Labs on Admission: I have personally reviewed following labs and imaging studies  CBC: Recent Labs  Lab 06/02/22 1342 06/02/22 1803  WBC 5.0  --   HGB 14.7 14.3  HCT 39.8 42.0  MCV 86.7  --   PLT 340  --    Basic Metabolic Panel: Recent Labs  Lab 06/02/22 1342 06/02/22 1803  NA 128* 129*  K 4.6 3.9  CL 91*  --   CO2 21*  --   GLUCOSE 620*  --   BUN 14  --   CREATININE 1.27*  --   CALCIUM 9.0  --    GFR: Estimated Creatinine Clearance: 59.3 mL/min (A) (by C-G formula based on SCr of 1.27 mg/dL (H)). Liver Function Tests: No results for input(s): "AST", "ALT", "ALKPHOS", "BILITOT", "PROT", "ALBUMIN" in the last 168 hours. No results for input(s): "LIPASE", "AMYLASE" in the last 168 hours. No results for input(s): "AMMONIA" in the last 168 hours. Coagulation Profile: No results for input(s): "INR", "PROTIME" in the last 168 hours. Cardiac Enzymes: No results for input(s): "CKTOTAL", "CKMB", "CKMBINDEX", "TROPONINI" in the last 168 hours. BNP (last 3 results) No results for input(s): "PROBNP" in the last 8760 hours. HbA1C: No results for input(s): "HGBA1C" in the last 72 hours. CBG: Recent Labs  Lab 06/02/22 1340 06/02/22 1709 06/02/22 1757 06/02/22 1853 06/02/22 2000  GLUCAP >600* 453* 410* 354* 262*    Lipid Profile: No results for input(s): "CHOL", "HDL", "LDLCALC", "TRIG", "CHOLHDL", "LDLDIRECT" in the last 72 hours. Thyroid Function Tests: No results for input(s): "TSH", "T4TOTAL", "FREET4", "T3FREE", "THYROIDAB" in the last 72 hours. Anemia Panel: No results for input(s): "VITAMINB12", "FOLATE", "FERRITIN", "TIBC", "IRON", "RETICCTPCT" in the last 72 hours. Urine analysis:    Component Value Date/Time   COLORURINE AMBER (A) 03/12/2022 1715   APPEARANCEUR CLEAR 03/12/2022 1715   LABSPEC 1.025 03/12/2022 1715   PHURINE 5.0 03/12/2022 1715   GLUCOSEU NEGATIVE 03/12/2022 1715   HGBUR NEGATIVE 03/12/2022 1715   BILIRUBINUR NEGATIVE 03/12/2022 1715   KETONESUR NEGATIVE 03/12/2022 1715   PROTEINUR 30 (A) 03/12/2022 1715   NITRITE NEGATIVE 03/12/2022 1715   LEUKOCYTESUR NEGATIVE 03/12/2022 1715    Radiological Exams on Admission: No results found.    Assessment/Plan   Principal Problem:   DKA (diabetic ketoacidosis) Active Problems:   Generalized weakness   AKI (acute kidney injury)  Dehydration   RA (rheumatoid arthritis)     #) Diabetic ketoacidosis: In the setting of no known prior history of diabetes, presentation today appears suggestive of a dx of dka on basis of the following laboratory eval: presenting blood sugar of greater than 600 a/w presenting blood gas consistent with metabolic acidosis,  while CMP demonstrates anion gap metabolic acidosis with serum bicarbonate of 21 and AG of 16, beta hydroxybutyric acid found to be elevated, and with the presence of urinary ketones. Additional presenting labs notable for corrected serum sodium of 136 once corrected for hyperglycemia and serum potassium of 4.6.  The patient's 2-month prednisone taper as noted, particular given the absence of any previous diagnosis of underlying diabetes.  Will check hemoglobin A1c level to further assess.  Given no prior diagnosis of underlying diabetes, will also check C-peptide to  evaluate endogenous insulin production as well as check antibeta islet cell antibodies.   No overt underlying source of infection at this time, including urinalysis that showed no evidence of pyuria.  ACS felt to be less likely in the absence of any recent chest pain.  However, will check EKG to further evaluate.  No evidence of acute focal neurologic deficits to suggest presenting acute CVA.  In the ED, insulin drip was initiated, and the following IVF's administered: LR x 20 mL per kg fluid bolus, as further quantified above, followed by initiation of continuous lactated Ringer's at 125 cc/h.. In setting of most recent potassium level of 4.6, and given most recent CBG result down to 261, with anticipation that next CBG will reflect blood sugar less than 250, will transition IVF's to D5-1/2NS with 10 mEq/L Kcl @ 125 cc/hr, as further detailed below.    Plan: DKA protocol initiated. Insulin drip per Endotool dka protocol. Q1H cbg's. Q4H BMP's in order to monitor ensuing AG, Na, and potassium.  IVF's: transition existing IVF's to D5-1/2NS with 10 mEq/L Kcl @ 125 cc/hr at this time. Check serum Mg and Phos levels, w/ prn supplementation per protocol. Once AG has closed and patient tolerating PO, will initiate sq basal insulin while continuing insulin drip for an additional two hours to prevent rebound hyperglycemia, before ultimately turning off the insulin drip. Monitor on telemetry. Monitor strict I's & O's.  An inpatient consult to diabetic educator has been placed. Prn IV Zofran. Check A1c. Check C-peptide, antibeta islet cell antibodies.  Check EKG.             #) Dehydration: Clinical suspicion for such, including the appearance of dry oral mucous membranes as well as laboratory findings notable for  UA demonstrating elevated specific gravity, while also noting resolution of mild initial tachycardia with interval IV fluids. Appears to be in the setting of a contribution from auto diuresis in  the setting fluid shift resulting from hyperglycemic gradient.  No e/o associated hypotension.     Plan: Monitor strict I's and O's.  Daily weights.  Repeat BMP in the morning. IVF's as described above.             #) Acute Kidney Injury: presenting creatinine 1.27 compared to most recent prior value of 0.82, as above. Appears to be prerenal in nature stemming from dehydration as a consequence of hyperglyemic-associated auto-diuresis, as above. UA demonstrating elevated specific gravity, but in the absence of any white blood cell red blood cell casts.   Plan: monitor strict I's & O's and daily weights. Attempt to avoid nephrotoxic agents. IVF's, as above.  Repeat bmp  in AM. Check serum mag level.             #) Generalized weakness: 1 week duration of generalized weakness in the absence of any acute focal neurologic deficits to suggest acute CVA.  Likely multifactorial in nature, with contributions from poorly controlled diabetes as well as resultant dehydration, as above.  No evidence of underlying infectious process at this time, as further detailed above.    Plan: Further evaluation management of presenting hyperglycemia, including plan for IVF's, as further detailed above.  Fall precautions.  Check TSH. PT consult placed for the morning.  Check A1c.              #) Rheumatoid arthritis: Documented history of such, on long steroid taper, now appearing to be down to prednisone 10 mg p.o. daily.  Likely contributory to the patient's presenting hyperglycemia in the setting of no prior known diagnosis of underlying diabetes, as further outlined above.  Plan: Resume home prednisone.  Further evaluation management of presenting hyperglycemia, as above.  Check hemoglobin A1c.  Consult placed to the diabetic educator, as above.     DVT prophylaxis: SCD's   Code Status: Full code Family Communication: none Disposition Plan: Per Rounding Team Consults called:  consult  placed to the diabetic educator, as above;  Admission status: Observation     I SPENT GREATER THAN 75  MINUTES IN CLINICAL CARE TIME/MEDICAL DECISION-MAKING IN COMPLETING THIS ADMISSION.      Chaney BornJustin B Johnny Gorter DO [facility]  From 7PM - 7AM   06/02/2022, 9:31 PM

## 2022-06-02 NOTE — ED Notes (Signed)
Insulin stopped per endo tool, BG 83. Pt A&O x4, no acute distress noted, denies complaints/needs. Call bell in reach. Report to floor nurse, Marena Chancy.

## 2022-06-03 DIAGNOSIS — E860 Dehydration: Secondary | ICD-10-CM | POA: Diagnosis not present

## 2022-06-03 DIAGNOSIS — E1165 Type 2 diabetes mellitus with hyperglycemia: Secondary | ICD-10-CM | POA: Diagnosis not present

## 2022-06-03 DIAGNOSIS — E1110 Type 2 diabetes mellitus with ketoacidosis without coma: Secondary | ICD-10-CM | POA: Diagnosis not present

## 2022-06-03 DIAGNOSIS — Z79899 Other long term (current) drug therapy: Secondary | ICD-10-CM | POA: Diagnosis not present

## 2022-06-03 DIAGNOSIS — E1310 Other specified diabetes mellitus with ketoacidosis without coma: Secondary | ICD-10-CM

## 2022-06-03 DIAGNOSIS — N179 Acute kidney failure, unspecified: Secondary | ICD-10-CM | POA: Diagnosis not present

## 2022-06-03 LAB — COMPREHENSIVE METABOLIC PANEL
ALT: 22 U/L (ref 0–44)
AST: 23 U/L (ref 15–41)
Albumin: 2.9 g/dL — ABNORMAL LOW (ref 3.5–5.0)
Alkaline Phosphatase: 68 U/L (ref 38–126)
Anion gap: 10 (ref 5–15)
BUN: 11 mg/dL (ref 6–20)
CO2: 25 mmol/L (ref 22–32)
Calcium: 8.8 mg/dL — ABNORMAL LOW (ref 8.9–10.3)
Chloride: 99 mmol/L (ref 98–111)
Creatinine, Ser: 0.83 mg/dL (ref 0.61–1.24)
GFR, Estimated: >60 mL/min (ref >60)
Glucose, Bld: 126 mg/dL — ABNORMAL HIGH (ref 70–99)
Potassium: 3.3 mmol/L — ABNORMAL LOW (ref 3.5–5.1)
Sodium: 134 mmol/L — ABNORMAL LOW (ref 135–145)
Total Bilirubin: 1.8 mg/dL — ABNORMAL HIGH (ref 0.3–1.2)
Total Protein: 5.7 g/dL — ABNORMAL LOW (ref 6.5–8.1)

## 2022-06-03 LAB — BLOOD GAS, VENOUS
Acid-Base Excess: 3.8 mmol/L — ABNORMAL HIGH (ref 0.0–2.0)
Bicarbonate: 29.2 mmol/L — ABNORMAL HIGH (ref 20.0–28.0)
O2 Saturation: 84.7 %
Patient temperature: 37
pCO2, Ven: 46 mmHg (ref 44–60)
pH, Ven: 7.41 (ref 7.25–7.43)
pO2, Ven: 52 mmHg — ABNORMAL HIGH (ref 32–45)

## 2022-06-03 LAB — BASIC METABOLIC PANEL
Anion gap: 12 (ref 5–15)
Anion gap: 13 (ref 5–15)
BUN: 13 mg/dL (ref 6–20)
BUN: 11 mg/dL (ref 6–20)
CO2: 23 mmol/L (ref 22–32)
CO2: 24 mmol/L (ref 22–32)
Calcium: 8.2 mg/dL — ABNORMAL LOW (ref 8.9–10.3)
Calcium: 9 mg/dL (ref 8.9–10.3)
Chloride: 94 mmol/L — ABNORMAL LOW (ref 98–111)
Chloride: 98 mmol/L (ref 98–111)
Creatinine, Ser: 0.92 mg/dL (ref 0.61–1.24)
Creatinine, Ser: 0.86 mg/dL (ref 0.61–1.24)
GFR, Estimated: >60 mL/min (ref >60)
GFR, Estimated: >60 mL/min (ref >60)
Glucose, Bld: 344 mg/dL — ABNORMAL HIGH (ref 70–99)
Glucose, Bld: 83 mg/dL (ref 70–99)
Potassium: 3.7 mmol/L (ref 3.5–5.1)
Potassium: 3.4 mmol/L — ABNORMAL LOW (ref 3.5–5.1)
Sodium: 129 mmol/L — ABNORMAL LOW (ref 135–145)
Sodium: 135 mmol/L (ref 135–145)

## 2022-06-03 LAB — CBC WITH DIFFERENTIAL/PLATELET
Abs Immature Granulocytes: 0.02 10*3/uL (ref 0.00–0.07)
Basophils Absolute: 0 10*3/uL (ref 0.0–0.1)
Basophils Relative: 1 %
Eosinophils Absolute: 0 10*3/uL (ref 0.0–0.5)
Eosinophils Relative: 0 %
HCT: 39.1 % (ref 39.0–52.0)
Hemoglobin: 14.5 g/dL (ref 13.0–17.0)
Immature Granulocytes: 0 %
Lymphocytes Relative: 28 %
Lymphs Abs: 1.5 10*3/uL (ref 0.7–4.0)
MCH: 31.8 pg (ref 26.0–34.0)
MCHC: 37.1 g/dL — ABNORMAL HIGH (ref 30.0–36.0)
MCV: 85.7 fL (ref 80.0–100.0)
Monocytes Absolute: 0.7 10*3/uL (ref 0.1–1.0)
Monocytes Relative: 12 %
Neutro Abs: 3.2 10*3/uL (ref 1.7–7.7)
Neutrophils Relative %: 59 %
Platelets: 292 10*3/uL (ref 150–400)
RBC: 4.56 MIL/uL (ref 4.22–5.81)
RDW: 14.2 % (ref 11.5–15.5)
WBC: 5.3 10*3/uL (ref 4.0–10.5)
nRBC: 0 % (ref 0.0–0.2)

## 2022-06-03 LAB — GLUCOSE, CAPILLARY
Glucose-Capillary: 439 mg/dL — ABNORMAL HIGH (ref 70–99)
Glucose-Capillary: 338 mg/dL — ABNORMAL HIGH (ref 70–99)
Glucose-Capillary: 218 mg/dL — ABNORMAL HIGH (ref 70–99)
Glucose-Capillary: 156 mg/dL — ABNORMAL HIGH (ref 70–99)
Glucose-Capillary: 71 mg/dL (ref 70–99)
Glucose-Capillary: 426 mg/dL — ABNORMAL HIGH (ref 70–99)
Glucose-Capillary: 373 mg/dL — ABNORMAL HIGH (ref 70–99)
Glucose-Capillary: 360 mg/dL — ABNORMAL HIGH (ref 70–99)

## 2022-06-03 LAB — BETA-HYDROXYBUTYRIC ACID: Beta-Hydroxybutyric Acid: 2.35 mmol/L — ABNORMAL HIGH (ref 0.05–0.27)

## 2022-06-03 LAB — HEMOGLOBIN A1C
Hgb A1c MFr Bld: 12.1 % — ABNORMAL HIGH (ref 4.8–5.6)
Mean Plasma Glucose: 300.57 mg/dL

## 2022-06-03 LAB — PHOSPHORUS: Phosphorus: 1.5 mg/dL — ABNORMAL LOW (ref 2.5–4.6)

## 2022-06-03 LAB — TSH: TSH: 0.994 u[IU]/mL (ref 0.350–4.500)

## 2022-06-03 LAB — MAGNESIUM
Magnesium: 1.8 mg/dL (ref 1.7–2.4)
Magnesium: 1.8 mg/dL (ref 1.7–2.4)
Magnesium: 1.7 mg/dL (ref 1.7–2.4)

## 2022-06-03 MED ORDER — INSULIN ASPART 100 UNIT/ML IJ SOLN
12.0000 [IU] | Freq: Once | INTRAMUSCULAR | Status: AC
  Administered 2022-06-03: 12 [IU] via SUBCUTANEOUS

## 2022-06-03 MED ORDER — INSULIN GLARGINE-YFGN 100 UNIT/ML ~~LOC~~ SOLN
10.0000 [IU] | Freq: Every day | SUBCUTANEOUS | Status: DC
  Administered 2022-06-03 – 2022-06-04 (×3): 10 [IU] via SUBCUTANEOUS
  Filled 2022-06-03 (×4): qty 0.1

## 2022-06-03 MED ORDER — METFORMIN HCL 500 MG PO TABS
500.0000 mg | ORAL_TABLET | Freq: Two times a day (BID) | ORAL | Status: DC
  Administered 2022-06-04 – 2022-06-05 (×3): 500 mg via ORAL
  Filled 2022-06-03 (×3): qty 1

## 2022-06-03 MED ORDER — IBUPROFEN 600 MG PO TABS
600.0000 mg | ORAL_TABLET | Freq: Three times a day (TID) | ORAL | Status: DC | PRN
  Administered 2022-06-04 (×2): 600 mg via ORAL
  Filled 2022-06-03 (×2): qty 1

## 2022-06-03 MED ORDER — INSULIN ASPART 100 UNIT/ML IJ SOLN
0.0000 [IU] | Freq: Every day | INTRAMUSCULAR | Status: DC
  Administered 2022-06-03: 5 [IU] via SUBCUTANEOUS

## 2022-06-03 MED ORDER — INSULIN ASPART 100 UNIT/ML IJ SOLN
0.0000 [IU] | Freq: Three times a day (TID) | INTRAMUSCULAR | Status: DC
  Administered 2022-06-03: 5 [IU] via SUBCUTANEOUS
  Administered 2022-06-04 (×2): 8 [IU] via SUBCUTANEOUS
  Administered 2022-06-04: 5 [IU] via SUBCUTANEOUS
  Administered 2022-06-05: 8 [IU] via SUBCUTANEOUS

## 2022-06-03 MED ORDER — INSULIN ASPART 100 UNIT/ML IJ SOLN
15.0000 [IU] | Freq: Once | INTRAMUSCULAR | Status: AC
  Administered 2022-06-03: 15 [IU] via SUBCUTANEOUS

## 2022-06-03 NOTE — Evaluation (Signed)
Physical Therapy Evaluation Patient Details Name: Darryl Reid MRN: [id number] DOB: 03/28/1970 Today's Date: 06/03/2022  History of Present Illness  52 yo male presents to [facility] from urgent care for blood glucose >600. Workup for DKA in setting of new diagnosis of DMII. PMH includes RA with recent 3 month steroid taper.  Clinical Impression   Pt presents with generalized weakness and impaired activity tolerance, pt subjectively reporting feeling off-balance but balance WFL during PT assessment. Pt to benefit from acute PT to address deficits. Pt ambulated hallway distance with slowed speed and intermittent pausing and holding onto wall given fatigue. PT anticipates pt will progress well acutely. PT to progress mobility as tolerated, and will continue to follow acutely.         Recommendations for follow up therapy are one component of a multi-disciplinary discharge planning process, led by the attending physician.  Recommendations may be updated based on patient status, additional functional criteria and insurance authorization.  Follow Up Recommendations       Assistance Recommended at Discharge PRN  Patient can return home with the following       Equipment Recommendations None recommended by PT  Recommendations for Other Services       Functional Status Assessment Patient has had a recent decline in their functional status and demonstrates the ability to make significant improvements in function in a reasonable and predictable amount of time.     Precautions / Restrictions Precautions Precautions: None Restrictions Weight Bearing Restrictions: No      Mobility  Bed Mobility Overal bed mobility: Needs Assistance Bed Mobility: Supine to Sit     Supine to sit: Modified independent (Device/Increase time)          Transfers Overall transfer level: Modified independent                      Ambulation/Gait Ambulation/Gait assistance:  Supervision Gait Distance (Feet): 200 Feet Assistive device: None Gait Pattern/deviations: Step-through pattern, Decreased stride length, Trunk flexed Gait velocity: decr     General Gait Details: slowed, effortful with further distance  Stairs            Wheelchair Mobility    Modified Rankin (Stroke Patients Only)       Balance Overall balance assessment: Mild deficits observed, not formally tested                                           Pertinent Vitals/Pain Pain Assessment Pain Assessment: 0-10 Pain Score: 3  Pain Location: L finger joints Pain Descriptors / Indicators: Sore Pain Intervention(s): Limited activity within patient's tolerance, Monitored during session, Repositioned    Home Living Family/patient expects to be discharged to:: Private residence Living Arrangements: Spouse/significant other Available Help at Discharge: Family Type of Home: Apartment Home Access: Stairs to enter   Secretary/administrator of Steps: 14   Home Layout: One level Home Equipment: Cane - single point      Prior Function Prior Level of Function : Independent/Modified Independent;Working/employed             Mobility Comments: works at [employer], on his feet all day       Hand Dominance   Dominant Hand: Right    Extremity/Trunk Assessment   Upper Extremity Assessment Upper Extremity Assessment: Defer to OT evaluation    Lower Extremity Assessment Lower Extremity Assessment: Generalized  weakness    Cervical / Trunk Assessment Cervical / Trunk Assessment: Normal  Communication   Communication: No difficulties  Cognition Arousal/Alertness: Awake/alert Behavior During Therapy: WFL for tasks assessed/performed, Flat affect Overall Cognitive Status: Within Functional Limits for tasks assessed                                          General Comments      Exercises     Assessment/Plan    PT Assessment Patient needs  continued PT services  PT Problem List Decreased strength;Decreased mobility;Decreased activity tolerance;Decreased balance       PT Treatment Interventions DME instruction;Therapeutic activities;Gait training;Therapeutic exercise;Patient/family education;Balance training;Stair training;Neuromuscular re-education;Functional mobility training    PT Goals (Current goals can be found in the Care Plan section)  Acute Rehab PT Goals PT Goal Formulation: With patient Time For Goal Achievement: 06/17/22 Potential to Achieve Goals: Good    Frequency Min 1X/week     Co-evaluation               AM-PAC PT "6 Clicks" Mobility  Outcome Measure Help needed turning from your back to your side while in a flat bed without using bedrails?: None Help needed moving from lying on your back to sitting on the side of a flat bed without using bedrails?: None Help needed moving to and from a bed to a chair (including a wheelchair)?: None Help needed standing up from a chair using your arms (e.g., wheelchair or bedside chair)?: None Help needed to walk in hospital room?: A Little Help needed climbing 3-5 steps with a railing? : A Little 6 Click Score: 22    End of Session   Activity Tolerance: Patient tolerated treatment well Patient left: in bed;with call bell/phone within reach;with nursing/sitter in room Nurse Communication: Mobility status PT Visit Diagnosis: Other abnormalities of gait and mobility (R26.89)    Time: 4765-4650 PT Time Calculation (min) (ACUTE ONLY): 13 min   Charges:   PT Evaluation $PT Eval Low Complexity: 1 Low          Sulma Ruffino S, PT DPT Acute Rehabilitation Services Pager [phone]  Office [phone]   Brexley Cutshaw E Christain [place] 06/03/2022, 1:18 PM

## 2022-06-03 NOTE — Plan of Care (Signed)
  Problem: Education: Goal: Knowledge of General Education information will improve Description: Including pain rating scale, medication(s)/side effects and non-pharmacologic comfort measures Outcome: Progressing   Problem: Health Behavior/Discharge Planning: Goal: Ability to manage health-related needs will improve Outcome: Progressing   Problem: Clinical Measurements: Goal: Ability to maintain clinical measurements within normal limits will improve Outcome: Progressing Goal: Will remain free from infection Outcome: Progressing Goal: Diagnostic test results will improve Outcome: Progressing Goal: Respiratory complications will improve Outcome: Progressing Goal: Cardiovascular complication will be avoided Outcome: Progressing   Problem: Activity: Goal: Risk for activity intolerance will decrease Outcome: Progressing   Problem: Coping: Goal: Level of anxiety will decrease Outcome: Progressing   Problem: Elimination: Goal: Will not experience complications related to bowel motility Outcome: Progressing Goal: Will not experience complications related to urinary retention Outcome: Progressing   Problem: Pain Managment: Goal: General experience of comfort will improve Outcome: Progressing   Problem: Skin Integrity: Goal: Risk for impaired skin integrity will decrease Outcome: Progressing   Problem: Education: Goal: Ability to describe self-care measures that may prevent or decrease complications (Diabetes Survival Skills Education) will improve Outcome: Progressing Goal: Individualized Educational Video(s) Outcome: Progressing   Problem: Coping: Goal: Ability to adjust to condition or change in health will improve Outcome: Progressing   Problem: Health Behavior/Discharge Planning: Goal: Ability to identify and utilize available resources and services will improve Outcome: Progressing Goal: Ability to manage health-related needs will improve Outcome: Progressing    Problem: Metabolic: Goal: Ability to maintain appropriate glucose levels will improve Outcome: Progressing   Problem: Nutritional: Goal: Maintenance of adequate nutrition will improve Outcome: Progressing Goal: Progress toward achieving an optimal weight will improve Outcome: Progressing   Problem: Skin Integrity: Goal: Risk for impaired skin integrity will decrease Outcome: Progressing   Problem: Tissue Perfusion: Goal: Adequacy of tissue perfusion will improve Outcome: Progressing

## 2022-06-03 NOTE — Inpatient Diabetes Management (Signed)
Inpatient Diabetes Program Recommendations  AACE/ADA: New Consensus Statement on Inpatient Glycemic Control (2015)  Target Ranges:  Prepandial:   less than 140 mg/dL      Peak postprandial:   less than 180 mg/dL (1-2 hours)      Critically ill patients:  140 - 180 mg/dL   Lab Results  Component Value Date   GLUCAP 156 (H) 06/03/2022   HGBA1C 12.1 (H) 06/03/2022    Review of Glycemic Control  Latest Reference Range & Units 06/03/22 08:38  Glucose-Capillary 70 - 99 mg/dL 128 (H)  (H): Data is abnormally high  Diabetes history: New DM Outpatient Diabetes medications: none Current orders for Inpatient glycemic control: Semglee 10 QD, Novolog 0-15 units TID and 0-5 units QHS  Received referral for DM education.  This DM coordinator is working remotely over the weekend.  Chart reviewed.  Has been on prednisone since January.  Presents in DKA.  BHB 4.42, CO2 21, AG 16, glucose 620 mg/dL.    Has been weak, thirsty and voiding frequently.  Has been drinking large amounts of sugary beverages.  61.2 kg-Might consider C-Peptide and GAD antibodies to determine T2DM vs T1DM.    Please use each patient interaction to provide diabetes education. Please educate patient on importance of eliminating any beverages with sugar, modifying carbohydrates, and portion control.  Instruct on insulin administration. Please allow patient to be actively engaged with diabetes management by allowing patient to check own glucose and self-administer insulin injections.  Educate on hypoglycemia, signs, symptoms and treatment.    Will attempt to reach a Swahili interpreter and call patient.    Will continue to follow while inpatient.  Thank you, Dulce Sellar, MSN, CDCES Diabetes Coordinator Inpatient Diabetes Program [phone] (team pager from 8a-5p)

## 2022-06-03 NOTE — Plan of Care (Signed)

## 2022-06-03 NOTE — Progress Notes (Signed)
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 Maggie Schwalbe 2/1 for ORIF 34 ESR 94 CRP 20 He was not a candidate for methotrexate sulfasalazine and Arava He was kept on prednisone and the plan was to try to either use Plaquenil or Humira He represented to [facility] ED with generalized weakness 1 week polyuria polydipsia and in fact had gone to rheumatology office because he was not feeling well and wanted to come off of the prednisone  CBG was >600 VBG 7.4 beta hydroxybutyrate 4.4 sodium 128 up to 1.6   Hospital-Problem based course  DKA Physiology resolved quickly-placed on sliding scale coverage and has uncontrolled hyperglycemia today so have given extra 15 units diabetic coordinator to come and assess in a.m. Start metformin 500 twice daily and titrate to goal over thousand as an outpatient Will need steroid sparing therapy and will CC Dr. Allena Katz with regards to this  Pseudohyponatremia Resolved  AKI resolved  Hypokalemia seems stable   DVT prophylaxis: SCD Code Status: Full Family Communication: None Disposition:  Status is: Observation The patient remains OBS appropriate and will d/c before 2 midnights.     Subjective: Awake coherent no distress feels a little warm to touch no fever overtly however no chills Eating drinking States weight gain while on prednisone Polydipsia is resolved  Objective: Vitals:   06/03/22 0409 06/03/22 0837 06/03/22 1222 06/03/22 1732  BP: 110/75 124/62 119/85 109/82  Pulse: 73 (!) 108 90 86  Resp: 18 17 19 16   Temp: (!) 97.3 F (36.3 C) 99.2 F (37.3 C) 97.8 F (36.6 C) 98.3 F (36.8 C)  TempSrc: Oral Oral Oral Oral  SpO2: 99% 99% 100% 90%   Weight: 61.2 kg     Height:        Intake/Output Summary (Last 24 hours) at 06/03/2022 1750 Last data filed at 06/03/2022 0700 Gross per 24 hour  Intake 2781.12 ml  Output --  Net 2781.12 ml   Filed Weights   06/02/22 1336 06/02/22 2237 06/03/22 0409  Weight: 65.8 kg 61.1 kg 61.2 kg    Examination:  EOMI NCAT looks younger than stated age no icterus no pallor Chest is clear no wheeze rales rhonchi ROM intact fingers do not show any dactylitis just weakness and cannot grip with good grip strength bilaterally S1-S2 no murmur Abdomen soft no rebound no guarding Neuro intact  Data Reviewed: personally reviewed   CBC    Component Value Date/Time   WBC 5.3 06/03/2022 0937   RBC 4.56 06/03/2022 0937   HGB 14.5 06/03/2022 0937   HCT 39.1 06/03/2022 0937   PLT 292 06/03/2022 0937   MCV 85.7 06/03/2022 0937   MCH 31.8 06/03/2022 0937   MCHC 37.1 (H) 06/03/2022 0937   RDW 14.2 06/03/2022 0937   LYMPHSABS 1.5 06/03/2022 0937   MONOABS 0.7 06/03/2022 0937   EOSABS 0.0 06/03/2022 0937   BASOSABS 0.0 06/03/2022 0937      Latest Ref Rng & Units 06/03/2022   10:46 AM 06/03/2022    9:37 AM 06/03/2022   12:12 AM  CMP  Glucose 70 - 99 mg/dL 83  867  672   BUN 6 - 20 mg/dL 11  11  13    Creatinine 0.61 -  1.24 mg/dL 7.62  8.31  5.17   Sodium 135 - 145 mmol/L 135  134  129   Potassium 3.5 - 5.1 mmol/L 3.4  3.3  3.7   Chloride 98 - 111 mmol/L 98  99  94   CO2 22 - 32 mmol/L 24  25  23    Calcium 8.9 - 10.3 mg/dL 9.0  8.8  8.2   Total Protein 6.5 - 8.1 g/dL  5.7    Total Bilirubin 0.3 - 1.2 mg/dL  1.8    Alkaline Phos 38 - 126 U/L  68    AST 15 - 41 U/L  23    ALT 0 - 44 U/L  22       Radiology Studies: No results found.   Scheduled Meds:  insulin aspart  0-15 Units Subcutaneous TID WC   insulin aspart  0-5 Units Subcutaneous QHS   insulin aspart  15 Units Subcutaneous Once   insulin glargine-yfgn  10 Units Subcutaneous QHS   [START ON 06/04/2022] metFORMIN  500 mg Oral BID WC    predniSONE  10 mg Oral Daily   Continuous Infusions:   LOS: 0 days   Time spent: 56  Rhetta Mura, MD [facility] To contact the attending provider between 7A-7P or the covering provider during after hours 7P-7A, please log into the web site [email or web address] and access using universal [place] password for that web site. If you do not have the password, please call the hospital operator.  06/03/2022, 5:50 PM

## 2022-06-04 DIAGNOSIS — E1310 Other specified diabetes mellitus with ketoacidosis without coma: Secondary | ICD-10-CM | POA: Diagnosis not present

## 2022-06-04 LAB — COMPREHENSIVE METABOLIC PANEL
ALT: 22 U/L (ref 0–44)
AST: 20 U/L (ref 15–41)
Albumin: 2.8 g/dL — ABNORMAL LOW (ref 3.5–5.0)
Alkaline Phosphatase: 59 U/L (ref 38–126)
Anion gap: 6 (ref 5–15)
BUN: 17 mg/dL (ref 6–20)
CO2: 22 mmol/L (ref 22–32)
Calcium: 8.3 mg/dL — ABNORMAL LOW (ref 8.9–10.3)
Chloride: 101 mmol/L (ref 98–111)
Creatinine, Ser: 0.89 mg/dL (ref 0.61–1.24)
GFR, Estimated: >60 mL/min (ref >60)
Glucose, Bld: 159 mg/dL — ABNORMAL HIGH (ref 70–99)
Potassium: 3.4 mmol/L — ABNORMAL LOW (ref 3.5–5.1)
Sodium: 129 mmol/L — ABNORMAL LOW (ref 135–145)
Total Bilirubin: 1 mg/dL (ref 0.3–1.2)
Total Protein: 5.4 g/dL — ABNORMAL LOW (ref 6.5–8.1)

## 2022-06-04 LAB — GLUCOSE, CAPILLARY
Glucose-Capillary: 118 mg/dL — ABNORMAL HIGH (ref 70–99)
Glucose-Capillary: 203 mg/dL — ABNORMAL HIGH (ref 70–99)
Glucose-Capillary: 228 mg/dL — ABNORMAL HIGH (ref 70–99)
Glucose-Capillary: 234 mg/dL — ABNORMAL HIGH (ref 70–99)
Glucose-Capillary: 236 mg/dL — ABNORMAL HIGH (ref 70–99)
Glucose-Capillary: 275 mg/dL — ABNORMAL HIGH (ref 70–99)
Glucose-Capillary: 291 mg/dL — ABNORMAL HIGH (ref 70–99)
Glucose-Capillary: 323 mg/dL — ABNORMAL HIGH (ref 70–99)
Glucose-Capillary: 67 mg/dL — ABNORMAL LOW (ref 70–99)

## 2022-06-04 LAB — C-PEPTIDE: C-Peptide: 0.5 ng/mL — ABNORMAL LOW (ref 1.1–4.4)

## 2022-06-04 LAB — MAGNESIUM: Magnesium: 2 mg/dL (ref 1.7–2.4)

## 2022-06-04 MED ORDER — POTASSIUM CHLORIDE CRYS ER 20 MEQ PO TBCR
40.0000 meq | EXTENDED_RELEASE_TABLET | Freq: Two times a day (BID) | ORAL | Status: DC
  Administered 2022-06-05 (×2): 40 meq via ORAL
  Filled 2022-06-04 (×2): qty 2

## 2022-06-04 MED ORDER — POTASSIUM CHLORIDE CRYS ER 20 MEQ PO TBCR
40.0000 meq | EXTENDED_RELEASE_TABLET | Freq: Once | ORAL | Status: AC
  Administered 2022-06-04: 40 meq via ORAL
  Filled 2022-06-04: qty 2

## 2022-06-04 NOTE — Progress Notes (Signed)
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 Maggie Schwalbe 2/1 for ORIF 34 ESR 94 CRP 20 He was not a candidate for methotrexate sulfasalazine and Arava He was kept on prednisone and the plan was to try to either use Plaquenil or Humira He represented to [facility] ED with generalized weakness 1 week polyuria polydipsia and in fact had gone to rheumatology office because he was not feeling well and wanted to come off of the prednisone  CBG was >600 VBG 7.4 beta hydroxybutyrate 4.4 sodium 128 up to 1.6   Hospital-Problem based course  DKA brittle diabetes Physiology resolved quickly-placed on sliding scale coverage  High blood sugars of 400s/6 PM and lows this morning of 60--will need further titration of meds Start metformin 500 twice daily --change Semglee insulin from 10 units daily to 5 daily--stop at bedtime ssi coverage From an academic perspective can follow C-peptide and anti-Hilet cell antibody-practically speaking however-patient has been on steroids and this is probably the cause of new onset diabetes  Rheumatoid arthritis on prednisone Will need steroid sparing therapy --recommend close follow-up with Dr. Allena Katz rheumatology in [place]-I did CC him today  Pseudohyponatremia Sodium again low in the 120s-fluid restrict  AKI resolved  NSVT in settingHypokalemia Potassium 3.4-given 40 of K on 4/7 Keep on monitors, check a.m. magnesium and potassium keep potassium >4.0   DVT prophylaxis: SCD Code Status: Full Family Communication: None Disposition:  Status is: Observation The patient remains OBS appropriate and will d/c before 2 midnights.     Subjective: well awake  coherent in nad Note fluctaitons of sugar over 24 h   Objective: Vitals:   06/03/22 2034 06/04/22 0001 06/04/22 0400 06/04/22 0736  BP: 108/69 105/79 104/76 103/72  Pulse: (!) 109 93 99 87  Resp: 18 18 16 19   Temp: 98.2 F (36.8 C) 98.3 F (36.8 C) 97.9 F (36.6 C) 97.9 F (36.6 C)  TempSrc: Oral Oral Oral Oral  SpO2: 98% 98% 97% 98%  Weight:   61.1 kg   Height:        Intake/Output Summary (Last 24 hours) at 06/04/2022 1405 Last data filed at 06/04/2022 0900 Gross per 24 hour  Intake 660 ml  Output 500 ml  Net 160 ml    Filed Weights   06/02/22 2237 06/03/22 0409 06/04/22 0400  Weight: 61.1 kg 61.2 kg 61.1 kg    Examination:  Eomi ncat no focal deficit No ict pallor S1 s 2no m Abd soft nt nd no rebound Neuro intact x 4 Had some discomfort in MCP L hand today--not swollen  Data Reviewed: personally reviewed   CBC    Component Value Date/Time   WBC 5.3 06/03/2022 0937   RBC 4.56 06/03/2022 0937   HGB 14.5 06/03/2022 0937   HCT 39.1 06/03/2022 0937   PLT 292 06/03/2022 0937   MCV 85.7 06/03/2022 0937   MCH 31.8 06/03/2022 0937   MCHC 37.1 (H) 06/03/2022 0937   RDW 14.2 06/03/2022 0937   LYMPHSABS 1.5 06/03/2022 0937   MONOABS 0.7 06/03/2022 0937   EOSABS 0.0 06/03/2022 0937   BASOSABS 0.0 06/03/2022 0937      Latest Ref Rng & Units 06/04/2022   12:32 AM 06/03/2022   10:46  AM 06/03/2022    9:37 AM  CMP  Glucose 70 - 99 mg/dL 967  83  893   BUN 6 - 20 mg/dL 17  11  11    Creatinine 0.61 - 1.24 mg/dL 8.10  1.75  1.02   Sodium 135 - 145 mmol/L 129  135  134   Potassium 3.5 - 5.1 mmol/L 3.4  3.4  3.3   Chloride 98 - 111 mmol/L 101  98  99   CO2 22 - 32 mmol/L 22  24  25    Calcium 8.9 - 10.3 mg/dL 8.3  9.0  8.8   Total Protein 6.5 - 8.1 g/dL 5.4   5.7   Total Bilirubin 0.3 - 1.2 mg/dL 1.0   1.8   Alkaline Phos 38 - 126 U/L 59   68   AST 15 - 41 U/L 20   23   ALT 0 - 44 U/L 22   22      Radiology Studies: No results found.   Scheduled Meds:   insulin aspart  0-15 Units Subcutaneous TID WC   insulin aspart  0-5 Units Subcutaneous QHS   insulin glargine-yfgn  10 Units Subcutaneous QHS   metFORMIN  500 mg Oral BID WC   predniSONE  10 mg Oral Daily   Continuous Infusions:   LOS: 0 days   Time spent: 69  Rhetta Mura, MD [facility] To contact the attending provider between 7A-7P or the covering provider during after hours 7P-7A, please log into the web site [email or web address] and access using universal [place] password for that web site. If you do not have the password, please call the hospital operator.  06/04/2022, 2:05 PM

## 2022-06-04 NOTE — Progress Notes (Signed)
Spoke with Zerita Boers, RN with Inpatient Diabetes Education. Per RN, staff unfortunately do not see patients on weekends. However, was told that Diabetes Education will see him tomorrow morning. Also stated that she would attempt to give pt a phone call in his room. RN to update pt and continue with diabetes education at bedside.

## 2022-06-04 NOTE — Inpatient Diabetes Management (Signed)
Inpatient Diabetes Program Recommendations  AACE/ADA: New Consensus Statement on Inpatient Glycemic Control (2015)  Target Ranges:  Prepandial:   less than 140 mg/dL      Peak postprandial:   less than 180 mg/dL (1-2 hours)      Critically ill patients:  140 - 180 mg/dL   Lab Results  Component Value Date   GLUCAP 203 (H) 06/04/2022   HGBA1C 12.1 (H) 06/03/2022    Review of Glycemic Control  Latest Reference Range & Units 06/03/22 20:32 06/04/22 00:01 06/04/22 04:03 06/04/22 04:30 06/04/22 07:05 06/04/22 07:34  Glucose-Capillary 70 - 99 mg/dL 193 (H) 790 (H) 67 (L) 118 (H) 228 (H) 203 (H)  (H): Data is abnormally high (L): Data is abnormally low  Current orders for Inpatient glycemic control:  Semglee 10 units QD, Novolog 0-15 units TID and 0-5 units QHS, Metformin 500 mg BID  Inpatient Diabetes Program Recommendations:    Novolog 0-9 units TID (to avoid over correction) Novolog 2 units TID with meals if he consumes at least 50%  Will continue to follow while inpatient.  Thank you, Dulce Sellar, MSN, CDCES Diabetes Coordinator Inpatient Diabetes Program [phone] (team pager from 8a-5p)

## 2022-06-04 NOTE — Progress Notes (Signed)
MD, pt had 5 bt NSVT no s/s, but K was 3.4, on call MD notified and ordered 40 mEq of PO potassium, will continue to monitor, Thanks Lavonda Jumbo RN.

## 2022-06-04 NOTE — Plan of Care (Signed)
  Problem: Education: Goal: Knowledge of General Education information will improve Description: Including pain rating scale, medication(s)/side effects and non-pharmacologic comfort measures Outcome: Progressing   Problem: Health Behavior/Discharge Planning: Goal: Ability to manage health-related needs will improve Outcome: Progressing   Problem: Clinical Measurements: Goal: Ability to maintain clinical measurements within normal limits will improve Outcome: Progressing Goal: Will remain free from infection Outcome: Progressing Goal: Cardiovascular complication will be avoided Outcome: Progressing   Problem: Activity: Goal: Risk for activity intolerance will decrease Outcome: Progressing   Problem: Coping: Goal: Level of anxiety will decrease Outcome: Progressing   Problem: Elimination: Goal: Will not experience complications related to bowel motility Outcome: Progressing Goal: Will not experience complications related to urinary retention Outcome: Progressing   Problem: Safety: Goal: Ability to remain free from injury will improve Outcome: Progressing   Problem: Skin Integrity: Goal: Risk for impaired skin integrity will decrease Outcome: Progressing   Problem: Education: Goal: Ability to describe self-care measures that may prevent or decrease complications (Diabetes Survival Skills Education) will improve Outcome: Progressing Goal: Individualized Educational Video(s) Outcome: Progressing

## 2022-06-04 NOTE — Evaluation (Signed)
Occupational Therapy Evaluation Patient Details Name: Darryl Reid MRN: [id number] DOB: 06/29/1970 Today's Date: 06/04/2022   History of Present Illness 52 yo [facility] for blood glucose >600. Workup for DKA in setting of new diagnosis of DMII. PMH includes RA with recent 3 month steroid taper.   Clinical Impression   Pt reports independence at baseline with ADLs and functional mobility, lives with family who can assist at d/c. Pt currently close to baseline, needing set up - supervision for ADLs, supervision for bed mobility, and supervision for transfers with RW. Pt with L hand weakness, with weak grasp and pain on anterior/lateral aspect of hand states has been ongoing since January 2024. Pt provided with squeeze ball and pink foam cube for strengthening. Pt presenting with impairments listed below, will follow acutely. Recommend OP OT at d/c to address L hand impairments.      Recommendations for follow up therapy are one component of a multi-disciplinary discharge planning process, led by the attending physician.  Recommendations may be updated based on patient status, additional functional criteria and insurance authorization.   Assistance Recommended at Discharge PRN  Patient can return home with the following A little help with walking and/or transfers;A little help with bathing/dressing/bathroom;Help with stairs or ramp for entrance    Functional Status Assessment  Patient has had a recent decline in their functional status and demonstrates the ability to make significant improvements in function in a reasonable and predictable amount of time.  Equipment Recommendations  None recommended by OT    Recommendations for Other Services PT consult     Precautions / Restrictions Precautions Precautions: None Restrictions Weight Bearing Restrictions: No      Mobility Bed Mobility Overal bed mobility: Needs Assistance Bed Mobility: Supine to  Sit     Supine to sit: Supervision          Transfers Overall transfer level: Needs assistance Equipment used: None Transfers: Sit to/from Stand Sit to Stand: Supervision                  Balance Overall balance assessment: Mild deficits observed, not formally tested                                         ADL either performed or assessed with clinical judgement   ADL Overall ADL's : Needs assistance/impaired Eating/Feeding: Set up;Sitting   Grooming: Set up;Sitting   Upper Body Bathing: Sitting;Supervision/ safety   Lower Body Bathing: Sit to/from stand;Sitting/lateral leans;Supervison/ safety   Upper Body Dressing : Sitting;Standing;Supervision/safety   Lower Body Dressing: Supervision/safety;Sitting/lateral leans   Toilet Transfer: Supervision/safety   Toileting- Clothing Manipulation and Hygiene: Supervision/safety   Tub/ Shower Transfer: Min guard;Tub transfer   Functional mobility during ADLs: Supervision/safety       Vision Baseline Vision/History: 1 Wears glasses Vision Assessment?: No apparent visual deficits     Perception Perception Perception Tested?: No   Praxis Praxis Praxis tested?: Not tested    Pertinent Vitals/Pain Pain Assessment Pain Assessment: Faces Pain Score: 4  Faces Pain Scale: Hurts little more Pain Location: dorsum of L hand, reports arthritic pain Pain Descriptors / Indicators: Sore Pain Intervention(s): Limited activity within patient's tolerance, Monitored during session, Repositioned     Hand Dominance Right   Extremity/Trunk Assessment Upper Extremity Assessment Upper Extremity Assessment: LUE deficits/detail LUE Deficits / Details: shoulder, elbow WFL,  reports L anterior wrist pain, pain with ulnar deviation and presents with weak grasp LUE Coordination: decreased fine motor   Lower Extremity Assessment Lower Extremity Assessment: Defer to PT evaluation   Cervical / Trunk  Assessment Cervical / Trunk Assessment: Normal   Communication Communication Communication: No difficulties   Cognition Arousal/Alertness: Awake/alert Behavior During Therapy: WFL for tasks assessed/performed, Flat affect Overall Cognitive Status: Within Functional Limits for tasks assessed                                       General Comments  VSS on RA    Exercises     Shoulder Instructions      Home Living Family/patient expects to be discharged to:: Private residence Living Arrangements: Spouse/significant other Available Help at Discharge: Family Type of Home: Apartment Home Access: Stairs to enter Secretary/administrator of Steps: 14   Home Layout: One level     Bathroom Shower/Tub: Chief Strategy Officer: Standard     Home Equipment: Cane - single Librarian, academic (2 wheels)          Prior Functioning/Environment Prior Level of Function : Independent/Modified Independent;Working/employed;Driving             Mobility Comments: works at [employer], on his feet all day. Walks with cane PRN ADLs Comments: ind with ADLs        OT Problem List: Decreased strength;Decreased range of motion;Decreased activity tolerance;Impaired UE functional use      OT Treatment/Interventions: Self-care/ADL training;Therapeutic exercise;Energy conservation;DME and/or AE instruction;Therapeutic activities;Patient/family education;Balance training    OT Goals(Current goals can be found in the care plan section) Acute Rehab OT Goals Patient Stated Goal: none stated OT Goal Formulation: With patient Time For Goal Achievement: 06/18/22 Potential to Achieve Goals: Good ADL Goals Pt Will Perform Tub/Shower Transfer: Tub transfer;Shower transfer;Independently;ambulating Pt/caregiver will Perform Home Exercise Program: Increased ROM;Increased strength;Left upper extremity;With written HEP provided;Independently  OT Frequency: Min 2X/week     Co-evaluation              AM-PAC OT "6 Clicks" Daily Activity     Outcome Measure Help from another person eating meals?: None Help from another person taking care of personal grooming?: None Help from another person toileting, which includes using toliet, bedpan, or urinal?: A Little Help from another person bathing (including washing, rinsing, drying)?: A Little Help from another person to put on and taking off regular upper body clothing?: A Little Help from another person to put on and taking off regular lower body clothing?: A Little 6 Click Score: 20   End of Session Nurse Communication: Mobility status  Activity Tolerance: Patient tolerated treatment well Patient left: in bed;with call bell/phone within reach  OT Visit Diagnosis: Muscle weakness (generalized) (M62.81)                Time: 9528-4132 OT Time Calculation (min): 17 min Charges:  OT General Charges $OT Visit: 1 Visit OT Evaluation $OT Eval Low Complexity: 1 Low  Sugey Trevathan K, OTD, OTR/L SecureChat Preferred Acute Rehab [phone]   Anntonette Madewell K Koonce 06/04/2022, 10:08 AM

## 2022-06-04 NOTE — Progress Notes (Signed)
Pt's CBG was 67, gave OJ and some graham crackers and PB and CBG went back to 118, on call MD notified, will continue to monitor, Thanks Lavonda Jumbo RN.

## 2022-06-04 NOTE — Plan of Care (Signed)
  Problem: Nutrition: Goal: Adequate nutrition will be maintained Outcome: Completed/Met   Problem: Nutritional: Goal: Maintenance of adequate nutrition will improve Outcome: Completed/Met   Problem: Skin Integrity: Goal: Risk for impaired skin integrity will decrease Outcome: Completed/Met

## 2022-06-05 ENCOUNTER — Other Ambulatory Visit (HOSPITAL_COMMUNITY): Payer: Self-pay

## 2022-06-05 DIAGNOSIS — E1310 Other specified diabetes mellitus with ketoacidosis without coma: Secondary | ICD-10-CM | POA: Diagnosis not present

## 2022-06-05 LAB — COMPREHENSIVE METABOLIC PANEL
ALT: 24 U/L (ref 0–44)
AST: 22 U/L (ref 15–41)
Albumin: 2.7 g/dL — ABNORMAL LOW (ref 3.5–5.0)
Alkaline Phosphatase: 60 U/L (ref 38–126)
Anion gap: 10 (ref 5–15)
BUN: 12 mg/dL (ref 6–20)
CO2: 22 mmol/L (ref 22–32)
Calcium: 8.4 mg/dL — ABNORMAL LOW (ref 8.9–10.3)
Chloride: 99 mmol/L (ref 98–111)
Creatinine, Ser: 0.85 mg/dL (ref 0.61–1.24)
GFR, Estimated: >60 mL/min (ref >60)
Glucose, Bld: 233 mg/dL — ABNORMAL HIGH (ref 70–99)
Potassium: 3.8 mmol/L (ref 3.5–5.1)
Sodium: 131 mmol/L — ABNORMAL LOW (ref 135–145)
Total Bilirubin: 0.9 mg/dL (ref 0.3–1.2)
Total Protein: 5.4 g/dL — ABNORMAL LOW (ref 6.5–8.1)

## 2022-06-05 LAB — GLUCOSE, CAPILLARY
Glucose-Capillary: 175 mg/dL — ABNORMAL HIGH (ref 70–99)
Glucose-Capillary: 119 mg/dL — ABNORMAL HIGH (ref 70–99)
Glucose-Capillary: 242 mg/dL — ABNORMAL HIGH (ref 70–99)
Glucose-Capillary: 256 mg/dL — ABNORMAL HIGH (ref 70–99)

## 2022-06-05 LAB — ANTI-ISLET CELL ANTIBODY: Pancreatic Islet Cell Antibody: NEGATIVE

## 2022-06-05 LAB — MAGNESIUM: Magnesium: 1.8 mg/dL (ref 1.7–2.4)

## 2022-06-05 MED ORDER — LIVING WELL WITH DIABETES BOOK
Freq: Once | Status: AC
  Administered 2022-06-05: 1
  Filled 2022-06-05: qty 1

## 2022-06-05 MED ORDER — BLOOD GLUCOSE METER KIT: PACK | 0 refills | Status: DC

## 2022-06-05 MED ORDER — PREDNISONE 10 MG PO TABS: 20.0000 mg | ORAL_TABLET | Freq: Every day | ORAL | Status: DC

## 2022-06-05 MED ORDER — INSULIN PEN NEEDLE 32G X 4 MM MISC: 1.0000 | Freq: Every day | 1 refills | Status: DC

## 2022-06-05 MED ORDER — INSULIN STARTER KIT- PEN NEEDLES (ENGLISH)
1.0000 | Freq: Once | Status: AC
  Administered 2022-06-05: 1
  Filled 2022-06-05: qty 1

## 2022-06-05 MED ORDER — MUSCLE RUB 10-15 % EX CREA
TOPICAL_CREAM | CUTANEOUS | Status: DC | PRN
  Filled 2022-06-05: qty 85

## 2022-06-05 MED ORDER — LANTUS SOLOSTAR 100 UNIT/ML ~~LOC~~ SOPN: 12.0000 [IU] | PEN_INJECTOR | Freq: Every day | SUBCUTANEOUS | 11 refills | Status: AC

## 2022-06-05 NOTE — Discharge Summary (Signed)
Darryl Reid Gregori YHT:[id number] DOB: 24-Jun-1970 DOA: 06/02/2022  PCP: Pcp, No  Admit date: 06/02/2022 Discharge date: 06/05/2022  Time spent: 35 minutes  Recommendations for Outpatient Follow-up:  Pcp establish care f/u 4/18 as scheduled  Rheumatology f/u    Discharge Diagnoses:  Principal Problem:   DKA (diabetic ketoacidosis) Active Problems:   Generalized weakness   AKI (acute kidney injury)   Dehydration   RA (rheumatoid arthritis)   Hyperglycemia   Discharge Condition: stable  Diet recommendation: carb modified  Filed Weights   06/03/22 0409 06/04/22 0400 06/05/22 0417  Weight: 61.2 kg 61.1 kg 60.4 kg    History of present illness:  From admission h and p Darryl Reid Vandenheuvel is a 52 y.o. male with medical history significant for rheumatoid arthritis who is admitted to [facility] on 06/02/2022 with diabetic ketoacidosis after presenting from home to [facility] ED complaining of generalized weakness.    The patient reports 1 week of generalized weakness in the absence of any acute focal weakness.  He also notes worsening polydipsia/polyuria over the last 2 to 3 weeks, which is new for him.  Denies any associated recent subjective fever, chills, rigors, or generalized myalgias.  No recent neck stiffness, rash, abdominal pain, nausea, vomiting, diarrhea, dysuria, increased hematuria, shortness of breath, cough.  He also denies any recent chest pain, palpitations, diaphoresis, dizziness, presyncope, or syncope.   Denies any known history of underlying diabetes.  However, he conveys a history of rheumatoid arthritis, for which he has been on a long 28-month steroid taper via prednisone, noting that he is now down to 10 mg p.o. daily of prednisone.   Hospital Course:  No known history of diabetes (though no regular medical care until recently), recent diagnosis of rheumatoid arthritis and started on steroids, preventing with DKA. Unclear if patient has baseline diabetes made  worse by steroids, or whether this is steroid-induced hyperglycemia. Treated with insulin and fluids and DKA resolved. Symptomatically patient is feeling well. DM educator met with patient. Will discharge with lantus and goal of keeping fastings 140-180. Patient's outpt prednisone is being tapered and I advised he f/u closely with his rheumatologist to continue that taper (and speed it up if possible). I gave the patient instructions on how to titrate insulin, both up and down, as needed. 140-180 is not an ideal long-term fasting goal but is a safe goal that will prevent dangerous extremes.   Procedures: none   Consultations: none  Discharge Exam: Vitals:   06/05/22 0808 06/05/22 1122  BP: 100/70 94/69  Pulse: 96 86  Resp: 18 18  Temp: 98.6 F (37 C) 98 F (36.7 C)  SpO2: 99% 98%    General: NAD Cardiovascular: RRR Respiratory: CTAB  Discharge Instructions   Discharge Instructions     Diet Carb Modified   Complete by: As directed    Increase activity slowly   Complete by: As directed       Allergies as of 06/05/2022   No Known Allergies      Medication List     TAKE these medications    blood glucose meter kit and supplies Dispense based on patient and insurance preference. Use up to four times daily as directed. (FOR ICD-10 E10.9, E11.9).   ferrous sulfate 325 (65 FE) MG EC tablet Take 325 mg by mouth daily.   ibuprofen 200 MG tablet Commonly known as: ADVIL Take 200 mg by mouth every 6 (six) hours as needed. What changed: Another medication with  the same name was removed. Continue taking this medication, and follow the directions you see here.   Insulin Pen Needle 32G X 4 MM Misc 1 each by Does not apply route daily in the afternoon.   Lantus SoloStar 100 UNIT/ML Solostar Pen Generic drug: insulin glargine Inject 12 Units into the skin at bedtime.   pantoprazole 40 MG tablet Commonly known as: PROTONIX Take 1 tablet (40 mg total) by mouth daily for 14  days.   predniSONE 10 MG tablet Commonly known as: DELTASONE Take 2 tablets (20 mg total) by mouth daily. What changed: how much to take       No Known Allergies  Follow-up Information     [facility]. Go on 06/15/2022.   Why: @8 :45am Contact information: [address] [place] [address] [phone]        Defoor, Dionne Ano, PA-C Follow up.   Specialty: Rheumatology Contact information: [address] [place] [address] [phone]                  The results of significant diagnostics from this hospitalization (including imaging, microbiology, ancillary and laboratory) are listed below for reference.    Significant Diagnostic Studies: No results found.  Microbiology: No results found for this or any previous visit (from the past 240 hour(s)).   Labs: Basic Metabolic Panel: Recent Labs  Lab 06/03/22 0012 06/03/22 0937 06/03/22 1046 06/04/22 0032 06/04/22 0656 06/05/22 0053  NA 129* 134* 135 129*  --  131*  K 3.7 3.3* 3.4* 3.4*  --  3.8  CL 94* 99 98 101  --  99  CO2 23 25 24 22   --  22  GLUCOSE 344* 126* 83 159*  --  233*  BUN 13 11 11 17   --  12  CREATININE 0.92 0.83 0.86 0.89  --  0.85  CALCIUM 8.2* 8.8* 9.0 8.3*  --  8.4*  MG 1.8  1.8 1.7  --   --  2.0 1.8  PHOS  --  1.5*  --   --   --   --    Liver Function Tests: Recent Labs  Lab 06/03/22 0937 06/04/22 0032 06/05/22 0053  AST 23 20 22   ALT 22 22 24   ALKPHOS 68 59 60  BILITOT 1.8* 1.0 0.9  PROT 5.7* 5.4* 5.4*  ALBUMIN 2.9* 2.8* 2.7*   No results for input(s): "LIPASE", "AMYLASE" in the last 168 hours. No results for input(s): "AMMONIA" in the last 168 hours. CBC: Recent Labs  Lab 06/02/22 1342 06/02/22 1803 06/03/22 0937  WBC 5.0  --  5.3  NEUTROABS  --   --  3.2  HGB 14.7 14.3 14.5  HCT 39.8 42.0 39.1  MCV 86.7  --  85.7  PLT 340  --  292   Cardiac Enzymes: No results for input(s): "CKTOTAL", "CKMB",  "CKMBINDEX", "TROPONINI" in the last 168 hours. BNP: BNP (last 3 results) No results for input(s): "BNP" in the last 8760 hours.  ProBNP (last 3 results) No results for input(s): "PROBNP" in the last 8760 hours.  CBG: Recent Labs  Lab 06/04/22 2336 06/05/22 0419 06/05/22 0631 06/05/22 0806 06/05/22 1121  GLUCAP 236* 175* 119* 242* 256*       Signed:  Silvano Bilis MD.  [facility] 06/05/2022, 3:55 PM

## 2022-06-05 NOTE — Progress Notes (Signed)
Occupational Therapy Treatment Patient Details Name: Darryl Reid MRN: [id number] DOB: 04/28/1970 Today's Date: 06/05/2022   History of present illness 52 yo male presents to [facility] from urgent care for blood glucose >600. Workup for DKA in setting of new diagnosis of DMII. PMH includes RA with recent 3 month steroid taper.   OT comments  Pt progressing towards goals this session, provided with L hand HEP and pt able to complete ADLs and transfers with mod I. Pt demo's good use of pink squeeze foam provided from prior session, pt independently demo'ing HEP. Pt presenting with impairments listed below, continue to recommend OP OT at d/c.   Recommendations for follow up therapy are one component of a multi-disciplinary discharge planning process, led by the attending physician.  Recommendations may be updated based on patient status, additional functional criteria and insurance authorization.    Assistance Recommended at Discharge PRN  Patient can return home with the following  A little help with walking and/or transfers;A little help with bathing/dressing/bathroom;Help with stairs or ramp for entrance   Equipment Recommendations  None recommended by OT    Recommendations for Other Services PT consult    Precautions / Restrictions Precautions Precautions: None Restrictions Weight Bearing Restrictions: No       Mobility Bed Mobility               General bed mobility comments: OOB in chair upon arrival and departure    Transfers Overall transfer level: Modified independent Equipment used: None Transfers: Sit to/from Stand                   Balance Overall balance assessment: No apparent balance deficits (not formally assessed)                                         ADL either performed or assessed with clinical judgement   ADL Overall ADL's : Modified independent                                             Extremity/Trunk Assessment Upper Extremity Assessment Upper Extremity Assessment: LUE deficits/detail LUE Deficits / Details: shoulder, elbow WFL, reports L anterior wrist pain, pain with ulnar deviation and presents with weak grasp LUE Coordination: decreased fine motor   Lower Extremity Assessment Lower Extremity Assessment: Defer to PT evaluation        Vision   Vision Assessment?: No apparent visual deficits   Perception Perception Perception: Not tested   Praxis Praxis Praxis: Not tested    Cognition Arousal/Alertness: Awake/alert Behavior During Therapy: WFL for tasks assessed/performed, Flat affect Overall Cognitive Status: Within Functional Limits for tasks assessed                                          Exercises Exercises: Other exercises Other Exercises Other Exercises: L pink squeeze foam x5 Other Exercises: L thumb circumduction x5 Other Exercises: L forearm pronation/supination Other Exercises: L thumb opposition    Shoulder Instructions       General Comments VSS on RA    Pertinent Vitals/ Pain       Pain Assessment Pain Assessment: Faces Pain Score: 4  Faces Pain Scale: Hurts little more Pain Location: dorsum of L hand/thumb Pain Descriptors / Indicators: Sore Pain Intervention(s): Limited activity within patient's tolerance, Monitored during session, Repositioned  Home Living                                          Prior Functioning/Environment              Frequency  Min 2X/week        Progress Toward Goals  OT Goals(current goals can now be found in the care plan section)  Progress towards OT goals: Progressing toward goals  Acute Rehab OT Goals Patient Stated Goal: none stated OT Goal Formulation: With patient Time For Goal Achievement: 06/18/22 Potential to Achieve Goals: Good ADL Goals Pt Will Perform Tub/Shower Transfer: Tub transfer;Shower  transfer;Independently;ambulating Pt/caregiver will Perform Home Exercise Program: Increased ROM;Increased strength;Left upper extremity;With written HEP provided;Independently  Plan Discharge plan remains appropriate;Frequency remains appropriate    Co-evaluation                 AM-PAC OT "6 Clicks" Daily Activity     Outcome Measure   Help from another person eating meals?: None Help from another person taking care of personal grooming?: None Help from another person toileting, which includes using toliet, bedpan, or urinal?: A Little Help from another person bathing (including washing, rinsing, drying)?: A Little Help from another person to put on and taking off regular upper body clothing?: A Little Help from another person to put on and taking off regular lower body clothing?: A Little 6 Click Score: 20    End of Session    OT Visit Diagnosis: Muscle weakness (generalized) (M62.81)   Activity Tolerance Patient tolerated treatment well   Patient Left in bed;with call bell/phone within reach   Nurse Communication Mobility status        Time: [id number] OT Time Calculation (min): 11 min  Charges: OT General Charges $OT Visit: 1 Visit OT Treatments $Therapeutic Activity: 8-22 mins  Carver FilaNatalie K, OTD, OTR/L SecureChat Preferred Acute Rehab [phone]   Darrah Dredge K Koonce 06/05/2022, 11:28 AM

## 2022-06-05 NOTE — Discharge Instructions (Signed)
Check your fasting glucose once a day Adjust insulin to maintain fasting glucose between 140 and 180

## 2022-06-05 NOTE — TOC Transition Note (Signed)
Transition of Care [facility]) - CM/SW Discharge Note   Patient Details  Name: Darryl Reid MRN: [id number] Date of Birth: [birth year]  Transition of Care [facility]) CM/SW Contact:  Leone Haven, RN Phone Number: 06/05/2022, 4:10 PM   Clinical Narrative:    Patient is for dc today, he states he has transport home, he has his script for the glucometer and meds.  He is indep.  He has follow up on AVS.  He has no other needs.    Final next level of care: Home/Self Care     Patient Goals and CMS Choice      Discharge Placement                         Discharge Plan and Services Additional resources added to the After Visit Summary for                                       Social Determinants of Health (SDOH) Interventions SDOH Screenings   Food Insecurity: No Food Insecurity (06/02/2022)  Housing: Low Risk  (06/02/2022)  Transportation Needs: No Transportation Needs (06/02/2022)  Utilities: Not At Risk (06/02/2022)  Tobacco Use: Low Risk  (06/02/2022)     Readmission Risk Interventions     No data to display

## 2022-06-05 NOTE — Inpatient Diabetes Management (Signed)
Inpatient Diabetes Program Recommendations  AACE/ADA: New Consensus Statement on Inpatient Glycemic Control (2015)  Target Ranges:  Prepandial:   less than 140 mg/dL      Peak postprandial:   less than 180 mg/dL (1-2 hours)      Critically ill patients:  140 - 180 mg/dL   Lab Results  Component Value Date   GLUCAP 242 (H) 06/05/2022   HGBA1C 12.1 (H) 06/03/2022    Review of Glycemic Control  Latest Reference Range & Units 06/04/22 07:34 06/04/22 11:11 06/04/22 16:01 06/04/22 19:53  Glucose-Capillary 70 - 99 mg/dL 007 (H) 121 (H) 975 (H) 323 (H)  (H): Data is abnormally high  Latest Reference Range & Units 06/04/22 23:36 06/05/22 04:19 06/05/22 06:31 06/05/22 08:06  Glucose-Capillary 70 - 99 mg/dL 883 (H) 254 (H) 982 (H) 242 (H)  (H): Data is abnormally high  Diabetes history: New DM Outpatient Diabetes medications: None Current orders for Inpatient glycemic control: Semglee 10 units QD, Novolog 0-15 units TID, Metformin 500 mg BID  For discharge, please consider:  Lantus 12 units QD Novolog 4 units TID with meals Needs PCP follow up in 1-2 weeks  Lantus Solostar pen-Order # [id number] Darylene Price Order # [id number] Insulin pen needles Order # [id number] Glucometer Order # [id number]  Spoke with patient at bedside.  Spoke with pt about new diagnosis. Discussed A1C results with him and explained what an A1C is, basic pathophysiology of DM Type 2, basic home care, basic diabetes diet nutrition principles, importance of checking CBGs and maintaining good CBG control to prevent long-term and short-term complications. Reviewed signs and symptoms of hyperglycemia and hypoglycemia and how to treat hypoglycemia at home. Also reviewed blood sugar goals at home.    Educated patient on insulin pen use at home. Reviewed contents of insulin flexpen starter kit. Reviewed all steps of insulin pen including attachment of needle, 2-unit air shot, dialing up dose, giving injection, removing needle, disposal  of sharps, storage of unused insulin, disposal of insulin etc. Patient able to provide successful return demonstration. Also reviewed troubleshooting with insulin pen. MD to give patient Rxs for insulin pens and insulin pen needles.  He downloaded the [facility] video on You Tube walking through the steps of administering insulin with the insulin pen.    Educated on The Plate Method, CHO's, portion control, CBGs at home fasting and mid afternoon, F/U with PCP every 3 months, bring meter to PCP office, long and short term complications of uncontrolled BG, and importance of exercise.  Reached out to [place], [facility] asking for help with PCP.  Appointment made with [facility] on April 18th.  Discussed hypoglycemia, signs, symptoms and treatment.  Ordered the Living Well with Diabetes booklet and insulin starter kit.  Will continue to follow while inpatient.  Thank you, Dulce Sellar, MSN, CDCES Diabetes Coordinator Inpatient Diabetes Program [phone] (team pager from 8a-5p)

## 2022-06-09 DIAGNOSIS — Z79899 Other long term (current) drug therapy: Secondary | ICD-10-CM | POA: Diagnosis not present

## 2022-06-09 DIAGNOSIS — M0579 Rheumatoid arthritis with rheumatoid factor of multiple sites without organ or systems involvement: Secondary | ICD-10-CM | POA: Diagnosis not present

## 2022-06-15 ENCOUNTER — Ambulatory Visit (INDEPENDENT_AMBULATORY_CARE_PROVIDER_SITE_OTHER): Payer: Medicaid Other

## 2022-06-15 ENCOUNTER — Other Ambulatory Visit: Payer: Self-pay

## 2022-06-15 VITALS — BP 119/76 | HR 89 | Temp 98.1°F | Ht 66.0 in | Wt 136.9 lb

## 2022-06-15 DIAGNOSIS — N179 Acute kidney failure, unspecified: Secondary | ICD-10-CM

## 2022-06-15 DIAGNOSIS — Z0289 Encounter for other administrative examinations: Secondary | ICD-10-CM

## 2022-06-15 DIAGNOSIS — M2550 Pain in unspecified joint: Secondary | ICD-10-CM | POA: Diagnosis not present

## 2022-06-15 DIAGNOSIS — E1165 Type 2 diabetes mellitus with hyperglycemia: Secondary | ICD-10-CM

## 2022-06-15 DIAGNOSIS — M069 Rheumatoid arthritis, unspecified: Secondary | ICD-10-CM

## 2022-06-15 DIAGNOSIS — E119 Type 2 diabetes mellitus without complications: Secondary | ICD-10-CM | POA: Insufficient documentation

## 2022-06-15 DIAGNOSIS — Z1211 Encounter for screening for malignant neoplasm of colon: Secondary | ICD-10-CM | POA: Insufficient documentation

## 2022-06-15 DIAGNOSIS — Z794 Long term (current) use of insulin: Secondary | ICD-10-CM

## 2022-06-15 NOTE — Assessment & Plan Note (Addendum)
Patient presents for hospital follow-up after recent admission for DKA.  A1c found to be 12.1.  Discharged on Lantus 12 units nightly.  He has actually been taking this multiple times a day depending on his prandial sugars.  Polydipsia and polyuria resolved.  Denies abdominal pain, nausea, vomiting, confusion, dizziness, sweating, shortness of breath, chest pain, or headache.  Vital signs within normal limits.  Well-appearing on exam.  Unsure the nature of his diabetes but suspect it may be related to insulin resistance compounded by steroids which she is no longer taking.  Reports blood sugars at home have been around 200 postprandial, below 100 fasting.  Denies symptoms of hypoglycemia thankfully. -Instructed to take Lantus 10 units nightly regardless of blood sugar readings. -Return in 3 months for follow-up, A1c -Urine microalbumin/creatinine ratio -Referral for eye exam and foot exam at follow-up -Repeat BMP

## 2022-06-15 NOTE — Patient Instructions (Addendum)
Darryl Reid, it was a pleasure seeing you today!  Today we discussed: Diabetes - take lantus 10 u nightly Rheumatoid arthritis - Continue taking Humira. Follow up with rheumatology. Colon cancer screening - referral to gastroenterology Social Work - they should reach out to you to discuss financial assistance/finding job  I have ordered the following labs today:  Lab Orders         BMP8+Anion Gap         Microalbumin / Creatinine Urine Ratio         Lipid Profile       Tests ordered today:  none  Referrals ordered today:   Referral Orders         Ambulatory referral to Gastroenterology       I have ordered the following medication/changed the following medications:   Stop the following medications: There are no discontinued medications.   Start the following medications: No orders of the defined types were placed in this encounter.    Follow-up: 3 months   Please make sure to arrive 15 minutes prior to your next appointment. If you arrive late, you may be asked to reschedule.   We look forward to seeing you next time. Please call our clinic at [phone] if you have any questions or concerns. The best time to call is Monday-Friday from 9am-4pm, but there is someone available 24/7. If after hours or the weekend, call the main hospital number and ask for the Internal Medicine Resident On-Call. If you need medication refills, please notify your pharmacy one week in advance and they will send [place] a request.  Thank you for letting us take part in your care. Wishing you the best!  Thank you, Adron Bene, MD

## 2022-06-15 NOTE — Assessment & Plan Note (Signed)
-   Repeat BMP

## 2022-06-15 NOTE — Assessment & Plan Note (Signed)
Patient presents with recent diagnosis of rheumatoid arthritis initially on prednisone but more recently switched to Humira.  He endorses some worsening of his symptoms which consist of ankle and MCP swelling and tenderness.  Exam without significant deformity though with some swelling and tenderness.  He feels that he may not have administered injection correctly and thus lost some of the medication.  We discussed that it takes a little while for the Humira to take effect.  Of note, he is no longer taking his prednisone. -Follow-up with rheumatology -Continue with Humira every 2 weeks

## 2022-06-15 NOTE — Assessment & Plan Note (Signed)
Patient presents with history of rheumatoid arthritis.

## 2022-06-15 NOTE — Progress Notes (Addendum)
CC: Establish care  HPI:  [name] is a 52 y.o. male with past medical history as below who presents to establish care.  Please see detailed assessment and plan for HPI.  Past medical history: Diabetes Rheumatoid Arthritis  Hospitalized twice this year for polyarthralgia and DKA. No history of surgery.  Family history: Darryl Reid - HTN, deceased Darryl Reid - no known health issues, deceased Darryl Reid - strange disease but doesn't know name (swollen legs?)  Social history: Lives with wife and 3 kids. Worked at [employer] but lost job. Occasional alcohol. No tobacco or marijuana.  No other substances. Had vasectomy.  Past Medical History:  Diagnosis Date   Polyarthralgia    RA (rheumatoid arthritis)    Review of Systems: Please see detailed assessment plan for pertinent ROS.  Physical Exam:  Vitals:   06/15/22 0840  BP: 119/76  Pulse: 89  Temp: 98.1 F (36.7 C)  TempSrc: Oral  SpO2: 100%  Weight: 136 lb 14.4 oz (62.1 kg)  Height:  (1.676 m)   Physical Exam Constitutional:      General: He is not in acute distress. HENT:     Head: Normocephalic and atraumatic.  Eyes:     Extraocular Movements: Extraocular movements intact.  Cardiovascular:     Rate and Rhythm: Normal rate and regular rhythm.     Heart sounds: No murmur heard. Pulmonary:     Effort: Pulmonary effort is normal.     Breath sounds: No wheezing, rhonchi or rales.  Musculoskeletal:     Cervical back: Neck supple.     Right lower leg: No edema.     Left lower leg: No edema.  Skin:    General: Skin is warm and dry.  Neurological:     Mental Status: He is alert and oriented to person, place, and time.  Psychiatric:        Mood and Affect: Mood normal.        Behavior: Behavior normal.      Assessment & Plan:   See Encounters Tab for problem based charting.  DMII (diabetes mellitus, type 2) Patient presents for hospital follow-up after recent admission for DKA.  A1c  found to be 12.1.  Discharged on Lantus 12 units nightly.  He has actually been taking this multiple times a day depending on his prandial sugars.  Polydipsia and polyuria resolved.  Denies abdominal pain, nausea, vomiting, confusion, dizziness, sweating, shortness of breath, chest pain, or headache.  Vital signs within normal limits.  Well-appearing on exam.  Unsure the nature of his diabetes but suspect it may be related to insulin resistance compounded by steroids which she is no longer taking.  Reports blood sugars at home have been around 200 postprandial, below 100 fasting.  Denies symptoms of hypoglycemia thankfully. -Instructed to take Lantus 10 units nightly regardless of blood sugar readings. -Return in 3 months for follow-up, A1c -Urine microalbumin/creatinine ratio -Referral for eye exam and foot exam at follow-up -Repeat BMP  RA (rheumatoid arthritis) Patient presents with history of rheumatoid arthritis.  AKI (acute kidney injury) Repeat BMP  Rheumatoid arthritis Patient presents with recent diagnosis of rheumatoid arthritis initially on prednisone but more recently switched to Humira.  He endorses some worsening of his symptoms which consist of ankle and MCP swelling and tenderness.  Exam without significant deformity though with some swelling and tenderness.  He feels that he may not have administered injection correctly and thus lost some of the medication.  We discussed  that it takes a little while for the Humira to take effect.  Of note, he is no longer taking his prednisone. -Follow-up with rheumatology -Continue with Humira every 2 weeks  Colon cancer screening Referral for colonoscopy  Refugee health examination Referral sent for managed Medicaid care management given recent job loss and refugee status.  Patient discussed with Dr.  Lafonda Mosses

## 2022-06-15 NOTE — Assessment & Plan Note (Signed)
Referral for colonoscopy °

## 2022-06-15 NOTE — Assessment & Plan Note (Signed)
Referral sent for managed Medicaid care management given recent job loss and refugee status.

## 2022-06-16 LAB — LIPID PANEL
Chol/HDL Ratio: 5.4 ratio — ABNORMAL HIGH (ref 0.0–5.0)
Cholesterol, Total: 210 mg/dL — ABNORMAL HIGH (ref 100–199)
HDL: 39 mg/dL — ABNORMAL LOW (ref >39)
LDL Chol Calc (NIH): 144 mg/dL — ABNORMAL HIGH (ref 0–99)
Triglycerides: 150 mg/dL — ABNORMAL HIGH (ref 0–149)
VLDL Cholesterol Cal: 27 mg/dL (ref 5–40)

## 2022-06-16 LAB — BMP8+ANION GAP
Anion Gap: 16 mmol/L (ref 10.0–18.0)
BUN/Creatinine Ratio: 6 — ABNORMAL LOW (ref 9–20)
BUN: 5 mg/dL — ABNORMAL LOW (ref 6–24)
CO2: 20 mmol/L (ref 20–29)
Calcium: 8.9 mg/dL (ref 8.7–10.2)
Chloride: 103 mmol/L (ref 96–106)
Creatinine, Ser: 0.79 mg/dL (ref 0.76–1.27)
Glucose: 209 mg/dL — ABNORMAL HIGH (ref 70–99)
Potassium: 3.6 mmol/L (ref 3.5–5.2)
Sodium: 139 mmol/L (ref 134–144)
eGFR: 108 mL/min/{1.73_m2} (ref >59)

## 2022-06-16 LAB — MICROALBUMIN / CREATININE URINE RATIO
Creatinine, Urine: 181.1 mg/dL
Microalb/Creat Ratio: 10 mg/g creat (ref 0–29)
Microalbumin, Urine: 17.9 ug/mL

## 2022-06-16 NOTE — Progress Notes (Signed)
Internal Medicine Clinic Attending  Case discussed with Dr. White  At the time of the visit.  We reviewed the resident's history and exam and pertinent patient test results.  I agree with the assessment, diagnosis, and plan of care documented in the resident's note.  

## 2022-06-21 ENCOUNTER — Other Ambulatory Visit: Payer: Medicaid Other

## 2022-06-21 MED ORDER — ATORVASTATIN CALCIUM 20 MG PO TABS: 20.0000 mg | ORAL_TABLET | Freq: Every day | ORAL | 2 refills | Status: DC

## 2022-06-21 NOTE — Patient Instructions (Signed)
  Medicaid Managed Care   Unsuccessful Outreach Note  06/21/2022 Name: Darryl Reid MRN: [id number] DOB: 1970-06-11  Referred by: Olegario Messier, MD Reason for referral : High Risk Managed Medicaid (MM social work unsuccessful telephone outreach )   An unsuccessful telephone outreach was attempted today. The patient was referred to the case management team for assistance with care management and care coordination.   Follow Up Plan: A HIPAA compliant phone message was left for the patient providing contact information and requesting a return call.   Abelino Derrick, MHA [facility]  Managed [facility] Social Worker [phone]

## 2022-06-21 NOTE — Addendum Note (Signed)
Addended by: Adron Bene on: 06/21/2022 08:33 AM   Modules accepted: Orders

## 2022-06-21 NOTE — Patient Outreach (Signed)
  Medicaid Managed Care   Unsuccessful Outreach Note  06/21/2022 Name: Quinterrius Waso Bi Belter MRN: [id number] DOB: 06/16/1970  Referred by: Nooruddin, Saad, MD Reason for referral : High Risk Managed Medicaid (MM social work unsuccessful telephone outreach )   An unsuccessful telephone outreach was attempted today. The patient was referred to the case management team for assistance with care management and care coordination.   Follow Up Plan: A HIPAA compliant phone message was left for the patient providing contact information and requesting a return call.   Darrel Baroni, BSW, MHA [place]  Managed Medicaid Social Worker [phone]  

## 2022-07-04 ENCOUNTER — Other Ambulatory Visit: Payer: Medicaid Other

## 2022-07-04 NOTE — Patient Outreach (Signed)
  Medicaid Managed Care Social Work Note  07/04/2022 Name:  Darryl Reid MRN:  [id number] DOB:  05-17-1970  Darryl Reid is an 52 y.o. year old male who is a primary patient of Nooruddin, Jason Fila, MD.  The Medicaid Managed Care Coordination team was consulted for assistance with:  Community Resources   Darryl Reid was given information about Medicaid Managed Care Coordination team services today. Darryl Reid Patient agreed to services and verbal consent obtained.  Engaged with patient  for by telephone forinitial visit in response to referral for case management and/or care coordination services.   Assessments/Interventions:  Review of past medical history, allergies, medications, health status, including review of consultants reports, laboratory and other test data, was performed as part of comprehensive evaluation and provision of chronic care management services.  SDOH: (Social Determinant of Health) assessments and interventions performed: BSW completed a telephone outreach with patient, he states he is unemployed right now and trying to find a job that can meet his accomodation with his health. His wife is the only income right now. They are not behind on rent or utilities right now, but would like resources for rent and utilities. Patient states he would also like resources for locating a job. BSW will mail resources to patient.  Advanced Directives Status:  Not addressed in this encounter.  Care Plan                 No Known Allergies  Medications Reviewed Today     Reviewed by Ephraim Hamburger, RN (Registered Nurse) on 06/02/22 at 1309  Med List Status: <None>   Medication Order Taking? Sig Documenting Provider Last Dose Status Informant  acetaminophen (TYLENOL) 325 MG tablet [id number]  Take 650 mg by mouth every 6 (six) hours as needed for mild pain. [provider]  Active Self, Pharmacy Records  ibuprofen (ADVIL) 800 MG tablet [id number]  Take 1  tablet (800 mg total) by mouth every 8 (eight) hours as needed. Burnadette Pop, MD  Active   pantoprazole (PROTONIX) 40 MG tablet [id number]  Take 1 tablet (40 mg total) by mouth daily for 14 days. Burnadette Pop, MD  Expired 03/27/22 2359             Patient Active Problem List   Diagnosis Date Noted   DMII (diabetes mellitus, type 2) (HCC) 06/15/2022   Colon cancer screening 06/15/2022   AKI (acute kidney injury) (HCC) 06/02/2022   RA (rheumatoid arthritis) (HCC) 06/02/2022   Hyperglycemia 06/02/2022   Abnormal LFTs 03/12/2022   Leukocytosis 03/12/2022   Rheumatoid arthritis 03/11/2022   Vasovagal syncope 02/04/2015   Refugee health examination 01/06/2015   Chronic low back pain 01/06/2015    Conditions to be addressed/monitored per PCP order:   community resources  There are no care plans that you recently modified to display for this patient.   Follow up:  Patient agrees to Care Plan and Follow-up.  Plan: The Managed Medicaid care management team will reach out to the patient again over the next 30 days.  Date/time of next scheduled Social Work care management/care coordination outreach:  08/04/22  Gus Puma, Kenard Gower, [facility] Health  Managed [facility] Social Worker [phone]

## 2022-07-04 NOTE — Patient Instructions (Signed)
   Mr. Darryl Reid was given information about Medicaid Managed Care team care coordination services as a part of their Healthy [facility] Medicaid benefit. Darryl Reid verbally consented to engagement with the [facility] Managed Care team.   If you are experiencing a medical emergency, please call 911 or report to your local emergency department or urgent care.   If you have a non-emergency medical problem during routine business hours, please contact your provider's office and ask to speak with a nurse.   For questions related to your Healthy [facility] health plan, please call: [phone] or visit the homepage here: [email or web address]  If you would like to schedule transportation through your Healthy [facility] plan, please call the following number at least 2 days in advance of your appointment: [phone]  For information about your ride after you set it up, call Ride Assist at [phone]. Use this number to activate a Will Call pickup, or if your transportation is late for a scheduled pickup. Use this number, too, if you need to make a change or cancel a previously scheduled reservation.  If you need transportation services right away, call [phone]. The after-hours call center is staffed 24 hours to handle ride assistance and urgent reservation requests (including discharges) 365 days a year. Urgent trips include sick visits, hospital discharge requests and life-sustaining treatment.  Call the [facility] Line at [phone], at any time, 24 hours a day, 7 days a week. If you are in danger or need immediate medical attention call 911.  If you would like help to quit smoking, call 1-800-QUIT-NOW ((239)720-8277) OR Espaol: 1-855-Djelo-Ya (4-742-595-6387) o para ms informacin haga clic aqu or Text READY to 564-332 to register via text  Darryl Reid - following are the goals we discussed in your visit today:   Goals Addressed    None      Social Worker will follow up in 30 days.   Gus Puma, Kenard Gower, MHA [facility] Health  Managed Medicaid Social Worker [phone]   Following is a copy of your plan of care:  There are no care plans that you recently modified to display for this patient.

## 2022-07-18 DIAGNOSIS — M0579 Rheumatoid arthritis with rheumatoid factor of multiple sites without organ or systems involvement: Secondary | ICD-10-CM | POA: Diagnosis not present

## 2022-07-18 DIAGNOSIS — Z79899 Other long term (current) drug therapy: Secondary | ICD-10-CM | POA: Diagnosis not present

## 2022-08-04 ENCOUNTER — Other Ambulatory Visit: Payer: Medicaid Other

## 2022-08-04 NOTE — Patient Instructions (Signed)
Visit Information  Mr. Darryl Reid was given information about Medicaid Managed Care team care coordination services as a part of their Healthy [facility] Medicaid benefit. Darryl Reid verbally consented to engagement with the [facility] Managed Care team.   If you are experiencing a medical emergency, please call 911 or report to your local emergency department or urgent care.   If you have a non-emergency medical problem during routine business hours, please contact your provider's office and ask to speak with a nurse.   For questions related to your Healthy [facility] health plan, please call: [phone] or visit the homepage here: [email or web address]  If you would like to schedule transportation through your Healthy [facility] plan, please call the following number at least 2 days in advance of your appointment: [phone]  For information about your ride after you set it up, call Ride Assist at [phone]. Use this number to activate a Will Call pickup, or if your transportation is late for a scheduled pickup. Use this number, too, if you need to make a change or cancel a previously scheduled reservation.  If you need transportation services right away, call [phone]. The after-hours call center is staffed 24 hours to handle ride assistance and urgent reservation requests (including discharges) 365 days a year. Urgent trips include sick visits, hospital discharge requests and life-sustaining treatment.  Call the [facility] Line at [phone], at any time, 24 hours a day, 7 days a week. If you are in danger or need immediate medical attention call 911.  If you would like help to quit smoking, call 1-800-QUIT-NOW ((845) 762-0557) OR Espaol: 1-855-Djelo-Ya (4-742-595-6387) o para ms informacin haga clic aqu or Text READY to 564-332 to register via text  Mr. Cazeau - following are the goals we discussed in your visit today:    Goals Addressed   None      Social Worker will follow up in 30 days.  Darryl Reid, Darryl Reid, MHA [facility] Health  Managed Medicaid Social Worker [phone]   Following is a copy of your plan of care:  There are no care plans that you recently modified to display for this patient.

## 2022-08-04 NOTE — Patient Outreach (Signed)
  Medicaid Managed Care Social Work Note  08/04/2022 Name:  Darryl Reid MRN:  [id number] DOB:  1970/04/14  Darryl Reid is an 52 y.o. year old male who is a primary patient of Nooruddin, Jason Fila, MD.  The Medicaid Managed Care Coordination team was consulted for assistance with:  Community Resources   Mr. Krauter was given information about Medicaid Managed Care Coordination team services today. Darryl Reid Patient agreed to services and verbal consent obtained.  Engaged with patient  for by telephone forfollow up visit in response to referral for case management and/or care coordination services.   Assessments/Interventions:  Review of past medical history, allergies, medications, health status, including review of consultants reports, laboratory and other test data, was performed as part of comprehensive evaluation and provision of chronic care management services.  SDOH: (Social Determinant of Health) assessments and interventions performed: BSW completed a telephone outreach with patient using an interpreter, patient states he did receieve the resources BSW sent but none of them were able to assist at this time. BSW encouraged patient to try them back in a few weeks to see if they will be able to assist. Patient states he has not had any luck with [place] [employer] BSW encouraged patient to try other employment sites such as the one of the list. BSW will send patient a list of open jobs to apply for.   Advanced Directives Status:  Not addressed in this encounter.  Care Plan                 No Known Allergies  Medications Reviewed Today     Reviewed by Ephraim Hamburger, RN (Registered Nurse) on 06/02/22 at 1309  Med List Status: <None>   Medication Order Taking? Sig Documenting Provider Last Dose Status Informant  acetaminophen (TYLENOL) 325 MG tablet [id number]  Take 650 mg by mouth every 6 (six) hours as needed for mild pain. [provider]  Active Self,  Pharmacy Records  ibuprofen (ADVIL) 800 MG tablet [id number]  Take 1 tablet (800 mg total) by mouth every 8 (eight) hours as needed. Burnadette Pop, MD  Active   pantoprazole (PROTONIX) 40 MG tablet [id number]  Take 1 tablet (40 mg total) by mouth daily for 14 days. Burnadette Pop, MD  Expired 03/27/22 2359             Patient Active Problem List   Diagnosis Date Noted   DMII (diabetes mellitus, type 2) (HCC) 06/15/2022   Colon cancer screening 06/15/2022   AKI (acute kidney injury) (HCC) 06/02/2022   RA (rheumatoid arthritis) (HCC) 06/02/2022   Hyperglycemia 06/02/2022   Abnormal LFTs 03/12/2022   Leukocytosis 03/12/2022   Rheumatoid arthritis 03/11/2022   Vasovagal syncope 02/04/2015   Refugee health examination 01/06/2015   Chronic low back pain 01/06/2015    Conditions to be addressed/monitored per PCP order:   Rent, utilities, employment  There are no care plans that you recently modified to display for this patient.   Follow up:  Patient agrees to Care Plan and Follow-up.  Plan: The Managed Medicaid care management team will reach out to the patient again over the next 30 days.  Date/time of next scheduled Social Work care management/care coordination outreach:  7//24 Gus Puma, Kenard Gower, [facility] Health  Managed [facility] Social Worker [phone]

## 2022-09-03 ENCOUNTER — Telehealth: Payer: Medicaid Other | Admitting: Family

## 2022-09-03 DIAGNOSIS — H6122 Impacted cerumen, left ear: Secondary | ICD-10-CM

## 2022-09-03 NOTE — Patient Instructions (Signed)

## 2022-09-03 NOTE — Progress Notes (Signed)
Virtual Visit Consent   Darryl Reid, you are scheduled for a virtual visit with a [facility] provider today. Just as with appointments in the office, your consent must be obtained to participate. Your consent will be active for this visit and any virtual visit you may have with one of our providers in the next 365 days. If you have a MyChart account, a copy of this consent can be sent to you electronically.  As this is a virtual visit, video technology does not allow for your provider to perform a traditional examination. This may limit your provider's ability to fully assess your condition. If your provider identifies any concerns that need to be evaluated in person or the need to arrange testing (such as labs, EKG, etc.), we will make arrangements to do so. Although advances in technology are sophisticated, we cannot ensure that it will always work on either your end or our end. If the connection with a video visit is poor, the visit may have to be switched to a telephone visit. With either a video or telephone visit, we are not always able to ensure that we have a secure connection.  By engaging in this virtual visit, you consent to the provision of healthcare and authorize for your insurance to be billed (if applicable) for the services provided during this visit. Depending on your insurance coverage, you may receive a charge related to this service.  I need to obtain your verbal consent now. Are you willing to proceed with your visit today? Darryl Reid has provided verbal consent on 09/03/2022 for a virtual visit (video or telephone). Darryl Rodney, FNP  Date: 09/03/2022 2:18 PM  Virtual Visit via Video Note   I, Darryl Reid, connected with  Darryl Reid  ([id number], 1970/05/08) on 09/03/22 at  2:00 PM EDT by a video-enabled telemedicine application and verified that I am speaking with the correct person using two identifiers.  Location: Patient: Virtual Visit  Location Patient: Home Provider: Virtual Visit Location Provider: Home Office   I discussed the limitations of evaluation and management by telemedicine and the availability of in person appointments. The patient expressed understanding and agreed to proceed.    History of Present Illness: Darryl Reid is a 52 y.o. who identifies as a male who was assigned male at birth, and is being seen today for left ear wax.    HPI: Ear Fullness  There is pain in the left ear. This is a new problem. The current episode started 1 to 4 weeks ago. The problem has been waxing and waning. The patient is experiencing no pain. He has tried nothing for the symptoms. The treatment provided no relief.    Problems:  Patient Active Problem List   Diagnosis Date Noted   DMII (diabetes mellitus, type 2) (HCC) 06/15/2022   Colon cancer screening 06/15/2022   AKI (acute kidney injury) (HCC) 06/02/2022   RA (rheumatoid arthritis) (HCC) 06/02/2022   Hyperglycemia 06/02/2022   Abnormal LFTs 03/12/2022   Leukocytosis 03/12/2022   Rheumatoid arthritis 03/11/2022   Vasovagal syncope 02/04/2015   Refugee health examination 01/06/2015   Chronic low back pain 01/06/2015    Allergies: No Known Allergies Medications:  Current Outpatient Medications:    atorvastatin (LIPITOR) 20 MG tablet, Take 1 tablet (20 mg total) by mouth daily., Disp: 30 tablet, Rfl: 2   blood glucose meter kit and supplies, Dispense based on patient and insurance preference. Use up to four times  daily as directed. (FOR ICD-10 E10.9, E11.9)., Disp: 1 each, Rfl: 0   ferrous sulfate 325 (65 FE) MG EC tablet, Take 325 mg by mouth daily., Disp: , Rfl:    ibuprofen (ADVIL) 200 MG tablet, Take 200 mg by mouth every 6 (six) hours as needed., Disp: , Rfl:    insulin glargine (LANTUS SOLOSTAR) 100 UNIT/ML Solostar Pen, Inject 12 Units into the skin at bedtime., Disp: 15 mL, Rfl: 11   Insulin Pen Needle 32G X 4 MM MISC, 1 each by Does not apply route  daily in the afternoon., Disp: 100 each, Rfl: 1   pantoprazole (PROTONIX) 40 MG tablet, Take 1 tablet (40 mg total) by mouth daily for 14 days. (Patient not taking: Reported on 06/02/2022), Disp: 14 tablet, Rfl: 0   predniSONE (DELTASONE) 10 MG tablet, Take 2 tablets (20 mg total) by mouth daily., Disp: , Rfl:   Observations/Objective: Patient is well-developed, well-nourished in no acute distress.  Resting comfortably  at home.  Head is normocephalic, atraumatic.  No labored breathing.  Speech is clear and coherent with logical content.  Patient is alert and oriented at baseline.    Assessment and Plan: 1. Impacted cerumen of left ear  Use Debrox drops OTC  Keep clean and dry Do not stick anything into ear Follow up if symptoms continue or worsen    Follow Up Instructions: I discussed the assessment and treatment plan with the patient. The patient was provided an opportunity to ask questions and all were answered. The patient agreed with the plan and demonstrated an understanding of the instructions.  A copy of instructions were sent to the patient via MyChart unless otherwise noted below.     The patient was advised to call back or seek an in-person evaluation if the symptoms worsen or if the condition fails to improve as anticipated.  Time:  I spent 8 minutes with the patient via telehealth technology discussing the above problems/concerns.    Darryl Rodney, FNP

## 2022-09-05 ENCOUNTER — Other Ambulatory Visit: Payer: Medicaid Other

## 2022-09-05 NOTE — Patient Outreach (Signed)
  Medicaid Managed Care   Unsuccessful Outreach Note  09/05/2022 Name: Darryl Reid MRN: [id number] DOB: [birth year]  Referred by: Olegario Messier, MD Reason for referral : High Risk Managed Medicaid (MM Social work unsuccessful telephone outreach )   An unsuccessful telephone outreach was attempted today. The patient was referred to the case management team for assistance with care management and care coordination.   Follow Up Plan: A HIPAA compliant phone message was left for the patient providing contact information and requesting a return call.   Abelino Derrick, MHA [facility] Health  Managed [facility] Social Worker [phone]

## 2022-09-05 NOTE — Patient Instructions (Signed)
  Medicaid Managed Care   Unsuccessful Outreach Note  09/05/2022 Name: Darryl Reid MRN: [id number] DOB: 09/22/1970  Referred by: Nooruddin, Saad, MD Reason for referral : High Risk Managed Medicaid (MM Social work unsuccessful telephone outreach )   An unsuccessful telephone outreach was attempted today. The patient was referred to the case management team for assistance with care management and care coordination.   Follow Up Plan: A HIPAA compliant phone message was left for the patient providing contact information and requesting a return call.   Anquanette Bahner, BSW, MHA [place]  Managed Medicaid Social Worker [phone]  

## 2022-09-27 ENCOUNTER — Ambulatory Visit: Payer: Medicaid Other | Admitting: Student

## 2022-09-27 ENCOUNTER — Encounter: Payer: Self-pay | Admitting: Student

## 2022-09-27 ENCOUNTER — Other Ambulatory Visit: Payer: Self-pay | Admitting: Internal Medicine

## 2022-09-27 VITALS — BP 109/71 | HR 70 | Temp 98.2°F | Ht 66.0 in | Wt 139.7 lb

## 2022-09-27 DIAGNOSIS — Z1211 Encounter for screening for malignant neoplasm of colon: Secondary | ICD-10-CM

## 2022-09-27 DIAGNOSIS — Z794 Long term (current) use of insulin: Secondary | ICD-10-CM

## 2022-09-27 DIAGNOSIS — E1165 Type 2 diabetes mellitus with hyperglycemia: Secondary | ICD-10-CM

## 2022-09-27 DIAGNOSIS — Z8639 Personal history of other endocrine, nutritional and metabolic disease: Secondary | ICD-10-CM

## 2022-09-27 MED ORDER — ATORVASTATIN CALCIUM 20 MG PO TABS: 20.0000 mg | ORAL_TABLET | Freq: Every day | ORAL | 2 refills | Status: DC

## 2022-09-27 NOTE — Patient Instructions (Signed)
We are checking your blood for Type 1 diabetes mellitus which  you must take insulin for. If the labs come back normal, then we can treat you for type 2 diabetes mellitus and treat you with oral medications. You need to take the medication called atorvastatin (Lipitor) to control your cholesterol. We will recheck the cholesterol level in one month.

## 2022-09-27 NOTE — Assessment & Plan Note (Addendum)
Patient was diagnosed with diabetes after he was hospitalized with DKA. He was sent home with basal insulin which he discontinued in June because his blood glucose was "ok". He did check his glucose intermittently throughout the month without insulin and they were on average between 90-140. Due to diagnosis of DM from DKA with a Hbg A1c of 12.1%, T1DM antibody panel ordered.  PLAN:  -Will hold insulin for now -F/u with T1DM antibody panel: if negative, will begin metformin for glycemic management  -If panel indicated T1DM, will begin insulin again  -Pt did not take the prescribed atorvastatin, will resend Rx and obtain lipid panel in 4 weeks -Repeat BMP today -Foot exam performed today by nursing -Opthalmology referral: sent -Urine microalbumin/creatinine ratio 05/2022: 10, will repeat in one year   Return in three months

## 2022-09-27 NOTE — Progress Notes (Signed)
Established Patient Office Visit  Subjective   Patient ID: Darryl Reid, male    DOB: 05/30/1970  Age: 52 y.o. MRN: [id number]  Chief Complaint  Patient presents with   Diabetes    Darryl Reid is a 52 year old male with a past medical history of presumed T2DM and RA who is here today for a three month follow up for diabetes mellitus. He reported stopping his insulin in June because his sugar levels were "ok". He check his glucose consistently for a week after discontinuing insulin and they were between 90-140s. He also checked his glucose intermittently with this past month and they were also within the 90-140 range. He denied symptoms of hypoglycemia, hyperglycemia (polyuria and polydipsia), and denied neuropathy symptoms. He did not have any further concerns or questions today.      Objective:     BP 109/71 (BP Location: Left Arm, Patient Position: Sitting, Cuff Size: Normal)   Pulse 70   Temp 98.2 F (36.8 C) (Oral)   Ht 5\' 6"  (1.676 m)   Wt 139 lb 11.2 oz (63.4 kg)   SpO2 100%   BMI 22.55 kg/m  BP Readings from Last 3 Encounters:  09/27/22 109/71  06/15/22 119/76  06/05/22 94/69      Physical Exam Vitals reviewed.  Constitutional:      Appearance: He is normal weight.  Cardiovascular:     Rate and Rhythm: Normal rate and regular rhythm.     Pulses: Normal pulses.          Dorsalis pedis pulses are 2+ on the right side and 2+ on the left side.     Heart sounds: No murmur heard. Skin:    General: Skin is warm and dry.  Neurological:     Mental Status: He is alert and oriented to person, place, and time.  Psychiatric:        Mood and Affect: Mood normal.      No results found for any visits on 09/27/22.  Last CBC Lab Results  Component Value Date   WBC 5.3 06/03/2022   HGB 14.5 06/03/2022   HCT 39.1 06/03/2022   MCV 85.7 06/03/2022   MCH 31.8 06/03/2022   RDW 14.2 06/03/2022   PLT 292 06/03/2022   Last metabolic panel Lab  Results  Component Value Date   GLUCOSE 209 (H) 06/15/2022   NA 139 06/15/2022   K 3.6 06/15/2022   CL 103 06/15/2022   CO2 20 06/15/2022   BUN 5 (L) 06/15/2022   CREATININE 0.79 06/15/2022   EGFR 108 06/15/2022   CALCIUM 8.9 06/15/2022   PHOS 1.5 (L) 06/03/2022   PROT 5.4 (L) 06/05/2022   ALBUMIN 2.7 (L) 06/05/2022   BILITOT 0.9 06/05/2022   ALKPHOS 60 06/05/2022   AST 22 06/05/2022   ALT 24 06/05/2022   ANIONGAP 10 06/05/2022   Last lipids Lab Results  Component Value Date   CHOL 210 (H) 06/15/2022   HDL 39 (L) 06/15/2022   LDLCALC 144 (H) 06/15/2022   TRIG 150 (H) 06/15/2022   CHOLHDL 5.4 (H) 06/15/2022   Last hemoglobin A1c Lab Results  Component Value Date   HGBA1C 12.1 (H) 06/03/2022      The 10-year ASCVD risk score (Arnett DK, et al., 2019) is: 9%    Assessment & Plan:   Problem List Items Addressed This Visit       Endocrine   DMII (diabetes mellitus, type 2) (HCC)  Patient was diagnosed with diabetes after he was hospitalized with DKA. He was sent home with basal insulin which he discontinued in June because his blood glucose was "ok". He did check his glucose intermittently throughout the month without insulin and they were on average between 90-140. Due to diagnosis of DM from DKA with a Hbg A1c of 12.1%, T1DM antibody panel ordered.  PLAN:  -Will hold insulin for now -F/u with T1DM antibody panel: if negative, will begin metformin for glycemic management  -If panel indicated T1DM, will begin insulin again  -Pt did not take the prescribed atorvastatin, will resend Rx and obtain lipid panel in 4 weeks -Repeat BMP today -Foot exam performed today by nursing -Opthalmology referral: sent -Urine microalbumin/creatinine ratio 05/2022: 10, will repeat in one year   Return in three months       Relevant Medications   atorvastatin (LIPITOR) 20 MG tablet   Other Relevant Orders   Hemoglobin A1c   Ambulatory referral to Ophthalmology   BMP8+Anion Gap    Other Visit Diagnoses     History of diabetic ketoacidosis    -  Primary   Relevant Orders   Hemoglobin A1c   ZNT8 Antibodies   IA-2 Autoantibodies   Insulin Alto Autobody   Glutamic acid decarboxylase auto abs   Anti-islet cell antibody   C-peptide   BMP8+Anion Gap   Screening for colon cancer       Relevant Orders   Ambulatory referral to Gastroenterology        Return in about 4 weeks (around 10/25/2022) for Lipid check with lab, may return to office in three months with me .    Faith Rogue, DO

## 2022-09-29 NOTE — Progress Notes (Signed)
Spoke with patient on the phone. We went over the labs that have resulted. He was notified that his Hgb A1c has decreased from 12.1 to 5.4. Patient did not change diet/lifestyle. He was notified that the islet cell Ab is negative, C-peptide has improved to a normal range, and that his BMP is stable. Patient understands that other labs have not resulted and that we will call him either later today or Monday once they have resulted. He is aware that we are still holding medications at this moment until the remaining labs have resulted.

## 2022-10-02 NOTE — Progress Notes (Signed)
Attempted to call patient today, did not answer. Will call again tomorrow to discuss results and recommendations.

## 2022-10-02 NOTE — Progress Notes (Signed)
Internal Medicine Clinic Attending  I saw and evaluated the patient.  I personally confirmed the key portions of the history and exam documented by Dr. Hessie Diener and I reviewed pertinent patient test results.  The assessment, diagnosis, and plan were formulated together and I agree with the documentation in the resident's note.

## 2022-10-05 NOTE — Progress Notes (Signed)
Attempted to call patient again today.  Will try again after other labs result.

## 2022-10-10 NOTE — Progress Notes (Signed)
Attempted to call patient again today. No response. Will send a letter with recommendations if he does not answer tomorrow.

## 2022-10-11 ENCOUNTER — Encounter: Payer: Self-pay | Admitting: Student

## 2022-10-11 NOTE — Progress Notes (Signed)
Letter sent.

## 2022-10-27 ENCOUNTER — Other Ambulatory Visit: Payer: Medicaid Other

## 2022-10-27 ENCOUNTER — Other Ambulatory Visit: Payer: Self-pay | Admitting: Student

## 2022-10-27 DIAGNOSIS — E1165 Type 2 diabetes mellitus with hyperglycemia: Secondary | ICD-10-CM

## 2022-12-27 ENCOUNTER — Encounter: Payer: Medicaid Other | Admitting: Student

## 2023-12-24 DIAGNOSIS — Z8639 Personal history of other endocrine, nutritional and metabolic disease: Secondary | ICD-10-CM | POA: Diagnosis not present

## 2023-12-24 DIAGNOSIS — M79642 Pain in left hand: Secondary | ICD-10-CM | POA: Diagnosis not present

## 2023-12-24 DIAGNOSIS — Z8739 Personal history of other diseases of the musculoskeletal system and connective tissue: Secondary | ICD-10-CM | POA: Diagnosis not present

## 2023-12-24 DIAGNOSIS — Z23 Encounter for immunization: Secondary | ICD-10-CM | POA: Diagnosis not present

## 2024-01-08 DIAGNOSIS — M79642 Pain in left hand: Secondary | ICD-10-CM | POA: Diagnosis not present

## 2024-01-08 DIAGNOSIS — Z758 Other problems related to medical facilities and other health care: Secondary | ICD-10-CM | POA: Diagnosis not present

## 2024-01-08 DIAGNOSIS — Z8739 Personal history of other diseases of the musculoskeletal system and connective tissue: Secondary | ICD-10-CM | POA: Diagnosis not present

## 2024-01-22 DIAGNOSIS — Z79899 Other long term (current) drug therapy: Secondary | ICD-10-CM | POA: Diagnosis not present

## 2024-01-22 DIAGNOSIS — M0579 Rheumatoid arthritis with rheumatoid factor of multiple sites without organ or systems involvement: Secondary | ICD-10-CM | POA: Diagnosis not present

## 2024-02-19 DIAGNOSIS — Z125 Encounter for screening for malignant neoplasm of prostate: Secondary | ICD-10-CM | POA: Diagnosis not present

## 2024-02-19 DIAGNOSIS — Z0000 Encounter for general adult medical examination without abnormal findings: Secondary | ICD-10-CM | POA: Diagnosis not present

## 2024-02-19 DIAGNOSIS — Z1331 Encounter for screening for depression: Secondary | ICD-10-CM | POA: Diagnosis not present

## 2024-02-19 DIAGNOSIS — Z1211 Encounter for screening for malignant neoplasm of colon: Secondary | ICD-10-CM | POA: Diagnosis not present

## 2024-03-04 ENCOUNTER — Ambulatory Visit (HOSPITAL_COMMUNITY)
Admission: EM | Admit: 2024-03-04 | Discharge: 2024-03-04 | Disposition: A | Discharge status: Home / Self Care | Attending: Family Medicine | Admitting: Family Medicine

## 2024-03-04 ENCOUNTER — Ambulatory Visit (INDEPENDENT_AMBULATORY_CARE_PROVIDER_SITE_OTHER)

## 2024-03-04 ENCOUNTER — Encounter (HOSPITAL_COMMUNITY): Payer: Self-pay

## 2024-03-04 DIAGNOSIS — M25562 Pain in left knee: Secondary | ICD-10-CM

## 2024-03-04 DIAGNOSIS — M542 Cervicalgia: Secondary | ICD-10-CM | POA: Diagnosis not present

## 2024-03-04 MED ORDER — METHOCARBAMOL 500 MG PO TABS: 500.0000 mg | ORAL_TABLET | Freq: Two times a day (BID) | ORAL | 0 refills | Status: AC | PRN

## 2024-03-04 MED ORDER — IBUPROFEN 800 MG PO TABS: 800.0000 mg | ORAL_TABLET | Freq: Three times a day (TID) | ORAL | 0 refills | Status: AC
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
## 2024-03-05 ENCOUNTER — Ambulatory Visit: Payer: Self-pay
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
